# Patient Record
Sex: Female | Born: 1985 | Race: White | Hispanic: No | Marital: Single
Health system: Midwestern US, Academic
[De-identification: ages and names within clinical notes are randomized; demographics above are authoritative.]

---

## 2018-03-18 ENCOUNTER — Encounter: Admit: 2018-03-18 | Discharge: 2018-03-18

## 2018-03-18 ENCOUNTER — Emergency Department: Admit: 2018-03-18 | Discharge: 2018-03-18

## 2018-03-18 ENCOUNTER — Observation Stay: Admit: 2018-03-18 | Discharge: 2018-03-19 | Payer: MEDICAID

## 2018-03-18 DIAGNOSIS — N83209 Unspecified ovarian cyst, unspecified side: Principal | ICD-10-CM

## 2018-03-18 DIAGNOSIS — M545 Low back pain: ICD-10-CM

## 2018-03-18 LAB — POC CREATININE, RAD: Lab: 0.8 mg/dL (ref 0.4–1.00)

## 2018-03-18 MED ORDER — GADOBENATE DIMEGLUMINE 529 MG/ML (0.1MMOL/0.2ML) IV SOLN
20 mL | Freq: Once | INTRAVENOUS | 0 refills | Status: CP
Start: 2018-03-18 — End: ?
  Administered 2018-03-19: 04:00:00 20 mL via INTRAVENOUS

## 2018-03-18 MED ORDER — MORPHINE 4 MG/ML IV CRTG
4 mg | Freq: Once | INTRAVENOUS | 0 refills | Status: CP
Start: 2018-03-18 — End: ?
  Administered 2018-03-19: 03:00:00 4 mg via INTRAVENOUS

## 2018-03-18 MED ORDER — LORAZEPAM 2 MG/ML IJ SOLN
.5-1 mg | Freq: Once | INTRAVENOUS | 0 refills | Status: CP
Start: 2018-03-18 — End: ?
  Administered 2018-03-19: 03:00:00 1 mg via INTRAVENOUS

## 2018-03-18 MED ORDER — CYCLOBENZAPRINE 10 MG PO TAB
10 mg | Freq: Once | ORAL | 0 refills | Status: DC
Start: 2018-03-18 — End: 2018-03-19

## 2018-03-18 MED ORDER — IOHEXOL 350 MG IODINE/ML IV SOLN
100 mL | Freq: Once | INTRAVENOUS | 0 refills | Status: CP
Start: 2018-03-18 — End: ?
  Administered 2018-03-19: 01:00:00 100 mL via INTRAVENOUS

## 2018-03-18 MED ORDER — LACTATED RINGERS IV SOLP
1000 mL | INTRAVENOUS | 0 refills | Status: CP
Start: 2018-03-18 — End: ?
  Administered 2018-03-18: 1000 mL via INTRAVENOUS

## 2018-03-18 MED ORDER — SODIUM CHLORIDE 0.9 % IJ SOLN
50 mL | Freq: Once | INTRAVENOUS | 0 refills | Status: CP
Start: 2018-03-18 — End: ?
  Administered 2018-03-19: 01:00:00 50 mL via INTRAVENOUS

## 2018-03-18 MED ORDER — FENTANYL CITRATE (PF) 50 MCG/ML IJ SOLN
50-75 ug | INTRAVENOUS | 0 refills | Status: CP | PRN
Start: 2018-03-18 — End: ?
  Administered 2018-03-19 (×3): 75 ug via INTRAVENOUS

## 2018-03-18 MED ORDER — ACETAMINOPHEN 500 MG PO TAB
1000 mg | Freq: Once | ORAL | 0 refills | Status: CP
Start: 2018-03-18 — End: ?
  Administered 2018-03-18: 1000 mg via ORAL

## 2018-03-19 ENCOUNTER — Emergency Department: Admit: 2018-03-18 | Discharge: 2018-03-18

## 2018-03-19 ENCOUNTER — Encounter: Admit: 2018-03-19 | Discharge: 2018-03-19

## 2018-03-19 DIAGNOSIS — D509 Iron deficiency anemia, unspecified: ICD-10-CM

## 2018-03-19 DIAGNOSIS — Z0489 Encounter for examination and observation for other specified reasons: ICD-10-CM

## 2018-03-19 DIAGNOSIS — F419 Anxiety disorder, unspecified: ICD-10-CM

## 2018-03-19 DIAGNOSIS — K029 Dental caries, unspecified: ICD-10-CM

## 2018-03-19 DIAGNOSIS — D72829 Elevated white blood cell count, unspecified: ICD-10-CM

## 2018-03-19 DIAGNOSIS — F17210 Nicotine dependence, cigarettes, uncomplicated: ICD-10-CM

## 2018-03-19 DIAGNOSIS — M5441 Lumbago with sciatica, right side: Principal | ICD-10-CM

## 2018-03-19 DIAGNOSIS — Z880 Allergy status to penicillin: ICD-10-CM

## 2018-03-19 DIAGNOSIS — R000 Tachycardia, unspecified: ICD-10-CM

## 2018-03-19 LAB — URINALYSIS DIPSTICK
Lab: NEGATIVE MMOL/L (ref 21–30)
Lab: NEGATIVE U/L (ref 7–40)
Lab: NEGATIVE U/L (ref 7–56)
Lab: NEGATIVE g/dL (ref 3.5–5.0)
Lab: NEGATIVE g/dL (ref 6.0–8.0)
Lab: NEGATIVE mg/dL — ABNORMAL HIGH (ref 8.5–10.6)
Lab: NEGATIVE mg/dL — ABNORMAL LOW (ref 0.3–1.2)

## 2018-03-19 LAB — URINALYSIS, MICROSCOPIC

## 2018-03-19 LAB — PERIPHERAL SMEAR

## 2018-03-19 LAB — COMPREHENSIVE METABOLIC PANEL
Lab: 139 MMOL/L (ref 137–147)
Lab: 7 K/UL — ABNORMAL HIGH (ref 3–12)
Lab: >60 mL/min (ref >60)
Lab: >60 mL/min — ABNORMAL HIGH (ref >60)

## 2018-03-19 LAB — IRON + BINDING CAPACITY + %SAT+ FERRITIN
Lab: 27 ug/dL — ABNORMAL LOW (ref 50–160)
Lab: 5 % — ABNORMAL LOW (ref 28–42)
Lab: 9 ng/mL — ABNORMAL LOW (ref 10–200)

## 2018-03-19 LAB — SED RATE: Lab: 15 mm/h (ref 0–20)

## 2018-03-19 LAB — CBC AND DIFF
Lab: 0.2 10*3/uL (ref 0–0.45)
Lab: 20 10*3/uL — ABNORMAL HIGH (ref 4.5–11.0)

## 2018-03-19 LAB — C REACTIVE PROTEIN (CRP): Lab: 0.6 mg/dL (ref <1.0)

## 2018-03-19 LAB — LACTIC ACID (BG - RAPID LACTATE): Lab: 0.9 MMOL/L (ref 0.5–2.0)

## 2018-03-19 MED ORDER — ENOXAPARIN 40 MG/0.4 ML SC SYRG
40 mg | Freq: Every day | SUBCUTANEOUS | 0 refills | Status: DC
Start: 2018-03-19 — End: 2018-03-19
  Administered 2018-03-19: 10:00:00 40 mg via SUBCUTANEOUS

## 2018-03-19 MED ORDER — ACETAMINOPHEN 325 MG PO TAB
650 mg | ORAL | 0 refills | Status: DC
Start: 2018-03-19 — End: 2018-03-19
  Administered 2018-03-19 (×2): 650 mg via ORAL

## 2018-03-19 MED ORDER — ALPRAZOLAM 0.5 MG PO TAB
1 mg | Freq: Once | ORAL | 0 refills | Status: CP
Start: 2018-03-19 — End: ?
  Administered 2018-03-19: 16:00:00 1 mg via ORAL

## 2018-03-19 MED ORDER — OXYCODONE 15 MG PO TAB
15 mg | ORAL | 0 refills | Status: DC | PRN
Start: 2018-03-19 — End: 2018-03-19
  Administered 2018-03-19 (×2): 15 mg via ORAL

## 2018-03-19 MED ORDER — GABAPENTIN 100 MG PO CAP
100 mg | ORAL | 0 refills | Status: DC
Start: 2018-03-19 — End: 2018-03-19
  Administered 2018-03-19: 10:00:00 100 mg via ORAL

## 2018-03-19 MED ORDER — GABAPENTIN 300 MG PO CAP
300 mg | ORAL_CAPSULE | Freq: Three times a day (TID) | ORAL | 0 refills | Status: AC
Start: 2018-03-19 — End: ?

## 2018-03-19 MED ORDER — OXYCODONE 15 MG PO TAB
15 mg | ORAL | 0 refills | 6.00000 days | Status: AC | PRN
Start: 2018-03-19 — End: 2019-11-30

## 2018-03-19 MED ORDER — MORPHINE 2 MG/ML IV CRTG
2-4 mg | INTRAVENOUS | 0 refills | Status: DC | PRN
Start: 2018-03-19 — End: 2018-03-19
  Administered 2018-03-19: 15:00:00 2 mg via INTRAVENOUS

## 2018-03-19 MED ORDER — GABAPENTIN 300 MG PO CAP
300 mg | ORAL | 0 refills | Status: DC
Start: 2018-03-19 — End: 2018-03-19

## 2018-03-19 MED ORDER — FENTANYL CITRATE (PF) 50 MCG/ML IJ SOLN
75 ug | INTRAVENOUS | 0 refills | Status: CP | PRN
Start: 2018-03-19 — End: ?
  Administered 2018-03-19 (×2): 75 ug via INTRAVENOUS

## 2018-03-25 LAB — CULTURE-BLOOD W/SENSITIVITY

## 2018-05-31 ENCOUNTER — Emergency Department: Admit: 2018-05-31 | Discharge: 2018-05-31

## 2018-05-31 ENCOUNTER — Encounter: Admit: 2018-05-31 | Discharge: 2018-05-31

## 2018-05-31 DIAGNOSIS — N83209 Unspecified ovarian cyst, unspecified side: Principal | ICD-10-CM

## 2018-05-31 DIAGNOSIS — M79605 Pain in left leg: ICD-10-CM

## 2018-05-31 MED ORDER — IMS MIXTURE TEMPLATE
15 mg | Freq: Once | ORAL | 0 refills | Status: CP
Start: 2018-05-31 — End: ?
  Administered 2018-06-01 (×2): 15 mg via ORAL

## 2018-05-31 MED ORDER — MORPHINE 2 MG/ML IV CRTG
4 mg | Freq: Once | INTRAVENOUS | 0 refills | Status: CP
Start: 2018-05-31 — End: ?
  Administered 2018-06-01: 03:00:00 4 mg via INTRAVENOUS

## 2018-05-31 MED ORDER — DIAZEPAM 5 MG/ML IJ SYRG
5 mg | Freq: Once | INTRAVENOUS | 0 refills | Status: CP
Start: 2018-05-31 — End: ?
  Administered 2018-06-01: 04:00:00 5 mg via INTRAVENOUS

## 2018-06-01 ENCOUNTER — Emergency Department: Admit: 2018-05-31 | Discharge: 2018-06-01 | Disposition: A | Discharge status: Home / Self Care | Payer: MEDICAID

## 2018-06-01 DIAGNOSIS — G8929 Other chronic pain: Principal | ICD-10-CM

## 2018-06-01 DIAGNOSIS — Z87891 Personal history of nicotine dependence: ICD-10-CM

## 2018-06-01 DIAGNOSIS — Z981 Arthrodesis status: ICD-10-CM

## 2018-06-01 DIAGNOSIS — M545 Low back pain: ICD-10-CM

## 2018-06-01 LAB — CBC AND DIFF
Lab: 0.1 10*3/uL (ref 0–0.45)
Lab: 0.2 10*3/uL (ref 0–0.20)
Lab: 12 10*3/uL — ABNORMAL HIGH (ref 4.5–11.0)

## 2018-06-01 LAB — COMPREHENSIVE METABOLIC PANEL
Lab: 0.5 mg/dL (ref 0.3–1.2)
Lab: 0.7 mg/dL — ABNORMAL LOW (ref 0.4–1.00)
Lab: 12 U/L (ref 7–40)
Lab: 139 MMOL/L — ABNORMAL HIGH (ref 137–147)
Lab: 3.9 g/dL (ref 3.5–5.0)
Lab: 6 mg/dL — ABNORMAL LOW (ref 7–25)
Lab: 7.1 g/dL — ABNORMAL HIGH (ref 6.0–8.0)
Lab: 8 U/L (ref 7–56)
Lab: 9 10*3/uL — ABNORMAL HIGH (ref 3–12)
Lab: 9.5 mg/dL — ABNORMAL HIGH (ref 8.5–10.6)
Lab: 90 mg/dL — ABNORMAL LOW (ref 70–100)
Lab: >60 mL/min (ref >60)
Lab: >60 mL/min — ABNORMAL HIGH (ref >60)

## 2018-06-01 LAB — SED RATE: Lab: 62 mm/h — ABNORMAL HIGH (ref 0–20)

## 2018-06-01 LAB — C REACTIVE PROTEIN (CRP): Lab: 2.2 mg/dL — ABNORMAL HIGH (ref <1.0)

## 2018-06-01 MED ORDER — GADOBENATE DIMEGLUMINE 529 MG/ML (0.1MMOL/0.2ML) IV SOLN
17 mL | Freq: Once | INTRAVENOUS | 0 refills | Status: CP
Start: 2018-06-01 — End: ?
  Administered 2018-06-01: 06:00:00 17 mL via INTRAVENOUS

## 2018-06-01 MED ORDER — MORPHINE 2 MG/ML IV CRTG
4 mg | INTRAVENOUS | 0 refills | Status: DC | PRN
Start: 2018-06-01 — End: 2018-06-01
  Administered 2018-06-01: 07:00:00 4 mg via INTRAVENOUS

## 2018-07-13 ENCOUNTER — Encounter: Admit: 2018-07-13 | Discharge: 2018-07-13

## 2018-07-13 ENCOUNTER — Encounter: Admit: 2018-07-13 | Discharge: 2018-07-14

## 2018-07-13 DIAGNOSIS — L0291 Cutaneous abscess, unspecified: ICD-10-CM

## 2018-07-13 DIAGNOSIS — D72829 Elevated white blood cell count, unspecified: ICD-10-CM

## 2018-07-13 DIAGNOSIS — Z967 Presence of other bone and tendon implants: ICD-10-CM

## 2018-07-13 DIAGNOSIS — M545 Low back pain: Secondary | ICD-10-CM

## 2018-07-13 MED ORDER — LEVONORGESTREL-ETHINYL ESTRAD 0.15-0.03 MG PO TAB
1 | Freq: Every day | ORAL | 0 refills | Status: DC
Start: 2018-07-13 — End: 2018-07-14

## 2018-07-13 MED ORDER — ALPRAZOLAM 1 MG PO TAB
1 mg | Freq: Three times a day (TID) | ORAL | 0 refills | Status: DC | PRN
Start: 2018-07-13 — End: 2018-07-16
  Administered 2018-07-14 – 2018-07-16 (×6): 1 mg via ORAL

## 2018-07-13 MED ORDER — KETOROLAC 15 MG/ML IJ SOLN
15 mg | Freq: Once | INTRAVENOUS | 0 refills | Status: CP
Start: 2018-07-13 — End: ?
  Administered 2018-07-14: 05:00:00 15 mg via INTRAVENOUS

## 2018-07-13 MED ORDER — OXYCODONE 15 MG PO TAB
15 mg | ORAL | 0 refills | Status: DC | PRN
Start: 2018-07-13 — End: 2018-07-15
  Administered 2018-07-14 – 2018-07-15 (×8): 15 mg via ORAL

## 2018-07-13 MED ORDER — PANTOPRAZOLE 40 MG PO TBEC
40 mg | Freq: Every day | ORAL | 0 refills | Status: DC
Start: 2018-07-13 — End: 2018-07-16
  Administered 2018-07-15 – 2018-07-16 (×2): 40 mg via ORAL

## 2018-07-13 MED ORDER — ACETAMINOPHEN 325 MG PO TAB
650 mg | ORAL | 0 refills | Status: DC
Start: 2018-07-13 — End: 2018-07-16
  Administered 2018-07-14 – 2018-07-16 (×9): 650 mg via ORAL

## 2018-07-13 MED ORDER — HEPARIN, PORCINE (PF) 5,000 UNIT/0.5 ML IJ SYRG
5000 [IU] | SUBCUTANEOUS | 0 refills | Status: DC
Start: 2018-07-13 — End: 2018-07-16
  Administered 2018-07-14 – 2018-07-16 (×7): 5000 [IU] via SUBCUTANEOUS

## 2018-07-13 MED ORDER — GABAPENTIN 300 MG PO CAP
300 mg | Freq: Three times a day (TID) | ORAL | 0 refills | Status: DC
Start: 2018-07-13 — End: 2018-07-14
  Administered 2018-07-14: 14:00:00 300 mg via ORAL

## 2018-07-14 ENCOUNTER — Encounter: Admit: 2018-07-14 | Discharge: 2018-07-14

## 2018-07-14 ENCOUNTER — Inpatient Hospital Stay: Admit: 2018-07-14 | Discharge: 2018-07-15

## 2018-07-14 ENCOUNTER — Inpatient Hospital Stay
Admit: 2018-07-14 | Discharge: 2018-07-16 | Disposition: A | Discharge status: Home / Self Care | Payer: MEDICAID | Source: Other Acute Inpatient Hospital

## 2018-07-14 LAB — CBC AND DIFF
Lab: 0.2 10*3/uL (ref 0–0.20)
Lab: 13 10*3/uL — ABNORMAL HIGH (ref 4.5–11.0)

## 2018-07-14 LAB — URINALYSIS DIPSTICK
Lab: NEGATIVE K/UL (ref 3–12)
Lab: NEGATIVE mL/min (ref 0–0.45)
Lab: NEGATIVE mL/min (ref 0–0.80)

## 2018-07-14 LAB — COMPREHENSIVE METABOLIC PANEL
Lab: 138 MMOL/L — ABNORMAL LOW (ref 137–147)
Lab: 3.8 MMOL/L — ABNORMAL LOW (ref 3.5–5.1)

## 2018-07-14 LAB — PHOSPHORUS: Lab: 5.1 mg/dL — ABNORMAL HIGH (ref 2.0–4.5)

## 2018-07-14 LAB — CBC: Lab: 11 K/UL — ABNORMAL HIGH (ref 4.5–11.0)

## 2018-07-14 LAB — C REACTIVE PROTEIN (CRP): Lab: 0 mg/dL (ref <1.0)

## 2018-07-14 LAB — SED RATE: Lab: 27 mm/h — ABNORMAL HIGH (ref 0–20)

## 2018-07-14 LAB — URINALYSIS, MICROSCOPIC

## 2018-07-14 LAB — LACTIC ACID (BG - RAPID LACTATE): Lab: 1 MMOL/L — ABNORMAL LOW (ref 0.5–2.0)

## 2018-07-14 LAB — PROCALCITONIN: Lab: 0 ng/mL (ref <0.11)

## 2018-07-14 LAB — MAGNESIUM: Lab: 2 mg/dL — ABNORMAL LOW (ref 1.6–2.6)

## 2018-07-14 MED ORDER — MORPHINE 2 MG/ML IV CRTG
2-4 mg | INTRAVENOUS | 0 refills | Status: DC | PRN
Start: 2018-07-14 — End: 2018-07-15
  Administered 2018-07-14 – 2018-07-15 (×8): 4 mg via INTRAVENOUS

## 2018-07-14 MED ORDER — BUSPIRONE 10 MG PO TAB
10 mg | Freq: Three times a day (TID) | ORAL | 0 refills | Status: DC
Start: 2018-07-14 — End: 2018-07-16
  Administered 2018-07-14 – 2018-07-16 (×7): 10 mg via ORAL

## 2018-07-14 MED ORDER — MORPHINE 4 MG/ML IV CRTG
4 mg | Freq: Once | INTRAVENOUS | 0 refills | Status: CP | PRN
Start: 2018-07-14 — End: ?
  Administered 2018-07-14: 06:00:00 4 mg via INTRAVENOUS

## 2018-07-14 MED ORDER — FENTANYL CITRATE (PF) 50 MCG/ML IJ SOLN
25-50 ug | INTRAVENOUS | 0 refills | Status: DC | PRN
Start: 2018-07-14 — End: 2018-07-14
  Administered 2018-07-14: 16:00:00 50 ug via INTRAVENOUS

## 2018-07-14 MED ORDER — FLUOXETINE 10 MG PO CAP
10 mg | Freq: Every day | ORAL | 0 refills | Status: DC
Start: 2018-07-14 — End: 2018-07-16
  Administered 2018-07-14 – 2018-07-16 (×3): 10 mg via ORAL

## 2018-07-14 MED ORDER — GADOBENATE DIMEGLUMINE 529 MG/ML (0.1MMOL/0.2ML) IV SOLN
15 mL | Freq: Once | INTRAVENOUS | 0 refills | Status: CP
Start: 2018-07-14 — End: ?
  Administered 2018-07-14: 06:00:00 15 mL via INTRAVENOUS

## 2018-07-14 MED ORDER — SODIUM CHLORIDE 0.9 % IV SOLP
1000 mL | INTRAVENOUS | 0 refills | Status: CP
Start: 2018-07-14 — End: ?
  Administered 2018-07-14: 23:00:00 1000 mL via INTRAVENOUS

## 2018-07-14 MED ORDER — IMS MIXTURE TEMPLATE
400 mg | Freq: Three times a day (TID) | ORAL | 0 refills | Status: DC
Start: 2018-07-14 — End: 2018-07-16
  Administered 2018-07-14 – 2018-07-16 (×12): 400 mg via ORAL

## 2018-07-15 LAB — CULTURE-URINE W/SENSITIVITY

## 2018-07-15 LAB — CBC: Lab: 10 K/UL — ABNORMAL LOW (ref >60)

## 2018-07-15 LAB — BASIC METABOLIC PANEL: Lab: 139 MMOL/L — ABNORMAL LOW (ref >60)

## 2018-07-15 MED ORDER — IRON SUCROSE 300 MG IRON/15 ML IV SOLN
300 mg | Freq: Every day | INTRAVENOUS | 0 refills | Status: DC
Start: 2018-07-15 — End: 2018-07-16
  Administered 2018-07-15 (×2): 300 mg via INTRAVENOUS

## 2018-07-15 MED ORDER — IMS MIXTURE TEMPLATE
20 mg | ORAL | 0 refills | Status: DC | PRN
Start: 2018-07-15 — End: 2018-07-16
  Administered 2018-07-15 – 2018-07-16 (×14): 20 mg via ORAL

## 2018-07-15 MED ORDER — HYDROMORPHONE (PF) 2 MG/ML IJ SYRG
.5-1 mg | INTRAVENOUS | 0 refills | Status: DC | PRN
Start: 2018-07-15 — End: 2018-07-16
  Administered 2018-07-15 – 2018-07-16 (×6): 1 mg via INTRAVENOUS

## 2018-07-16 ENCOUNTER — Inpatient Hospital Stay: Admit: 2018-07-14 | Discharge: 2018-07-14

## 2018-07-16 DIAGNOSIS — F419 Anxiety disorder, unspecified: ICD-10-CM

## 2018-07-16 DIAGNOSIS — Z90710 Acquired absence of both cervix and uterus: ICD-10-CM

## 2018-07-16 DIAGNOSIS — Z9049 Acquired absence of other specified parts of digestive tract: ICD-10-CM

## 2018-07-16 DIAGNOSIS — T8484XA Pain due to internal orthopedic prosthetic devices, implants and grafts, initial encounter: Principal | ICD-10-CM

## 2018-07-16 DIAGNOSIS — Z981 Arthrodesis status: ICD-10-CM

## 2018-07-16 DIAGNOSIS — M5431 Sciatica, right side: ICD-10-CM

## 2018-07-16 DIAGNOSIS — K219 Gastro-esophageal reflux disease without esophagitis: ICD-10-CM

## 2018-07-16 DIAGNOSIS — M545 Low back pain: ICD-10-CM

## 2018-07-16 DIAGNOSIS — G8928 Other chronic postprocedural pain: ICD-10-CM

## 2018-07-16 DIAGNOSIS — Z87891 Personal history of nicotine dependence: ICD-10-CM

## 2018-07-16 DIAGNOSIS — F329 Major depressive disorder, single episode, unspecified: ICD-10-CM

## 2018-07-16 LAB — CBC: Lab: 11 K/UL — ABNORMAL HIGH (ref 4.5–11.0)

## 2018-07-16 MED ORDER — OXYCODONE 20 MG PO TAB
20 mg | ORAL_TABLET | ORAL | 0 refills | 6.00000 days | Status: AC | PRN
Start: 2018-07-16 — End: 2018-07-16

## 2018-07-20 LAB — CULTURE-BLOOD W/SENSITIVITY

## 2019-11-28 ENCOUNTER — Encounter: Admit: 2019-11-28 | Discharge: 2019-11-28

## 2019-11-28 ENCOUNTER — Emergency Department: Admit: 2019-11-28 | Discharge: 2019-11-28

## 2019-11-28 ENCOUNTER — Inpatient Hospital Stay: Admit: 2019-11-28 | Payer: MEDICAID

## 2019-11-28 DIAGNOSIS — N83209 Unspecified ovarian cyst, unspecified side: Secondary | ICD-10-CM

## 2019-11-28 DIAGNOSIS — M549 Dorsalgia, unspecified: Secondary | ICD-10-CM

## 2019-11-28 LAB — URINALYSIS MICROSCOPIC REFLEX TO CULTURE

## 2019-11-28 LAB — URINALYSIS DIPSTICK REFLEX TO CULTURE
Lab: NEGATIVE
Lab: NEGATIVE
Lab: NEGATIVE
Lab: NEGATIVE
Lab: NEGATIVE
Lab: NEGATIVE

## 2019-11-28 LAB — COMPREHENSIVE METABOLIC PANEL
Lab: 0.5 mg/dL (ref 0.3–1.2)
Lab: 0.6 mg/dL (ref 0.4–1.00)
Lab: 101 MMOL/L (ref 98–110)
Lab: 139 MMOL/L (ref 137–147)
Lab: 14 K/UL — ABNORMAL HIGH (ref 3–12)
Lab: 141 U/L — ABNORMAL HIGH (ref 25–110)
Lab: 24 MMOL/L (ref 21–30)
Lab: 4.2 MMOL/L (ref 3.5–5.1)
Lab: 4.2 g/dL (ref 3.5–5.0)
Lab: 48 U/L — ABNORMAL HIGH (ref 7–40)
Lab: 49 U/L (ref 7–56)
Lab: 5 mg/dL — ABNORMAL LOW (ref 7–25)
Lab: 7.2 g/dL (ref 6.0–8.0)
Lab: 85 mg/dL (ref 70–100)
Lab: 9.5 mg/dL (ref 8.5–10.6)
Lab: >60 mL/min (ref >60)
Lab: >60 mL/min — ABNORMAL HIGH (ref >60)

## 2019-11-28 LAB — C REACTIVE PROTEIN (CRP): Lab: 1.3 mg/dL — ABNORMAL HIGH (ref <1.0)

## 2019-11-28 LAB — CBC AND DIFF
Lab: 0.1 10*3/uL (ref 0–0.45)
Lab: 0.2 10*3/uL (ref 0–0.20)
Lab: 17 10*3/uL — ABNORMAL HIGH (ref 4.5–11.0)
Lab: 19 (ref <20.7)

## 2019-11-28 LAB — BETA-HCG: Lab: <1 U/L (ref <5)

## 2019-11-28 LAB — SED RATE: Lab: 35 mm/h — ABNORMAL HIGH (ref 0–20)

## 2019-11-28 LAB — POC LACTATE: Lab: 0.7 MMOL/L (ref 0.5–2.0)

## 2019-11-28 MED ORDER — MORPHINE 2 MG/ML IV SYRG
2-4 mg | INTRAVENOUS | 0 refills | Status: DC | PRN
Start: 2019-11-28 — End: 2019-12-01
  Administered 2019-11-29 – 2019-12-01 (×16): 4 mg via INTRAVENOUS

## 2019-11-28 MED ORDER — OXYCODONE 5 MG PO TAB
5-15 mg | ORAL | 0 refills | Status: DC | PRN
Start: 2019-11-28 — End: 2019-11-29
  Administered 2019-11-28 – 2019-11-29 (×6): 15 mg via ORAL

## 2019-11-28 MED ORDER — ENOXAPARIN 40 MG/0.4 ML SC SYRG
40 mg | Freq: Every day | SUBCUTANEOUS | 0 refills | Status: DC
Start: 2019-11-28 — End: 2019-12-02
  Administered 2019-11-29 – 2019-11-30 (×2): 40 mg via SUBCUTANEOUS

## 2019-11-28 MED ORDER — SENNOSIDES-DOCUSATE SODIUM 8.6-50 MG PO TAB
1 | Freq: Two times a day (BID) | ORAL | 0 refills | Status: DC | PRN
Start: 2019-11-28 — End: 2019-12-02

## 2019-11-28 MED ORDER — MORPHINE 2 MG/ML IV SYRG
4 mg | Freq: Once | INTRAVENOUS | 0 refills | Status: CP
Start: 2019-11-28 — End: ?
  Administered 2019-11-28: 20:00:00 4 mg via INTRAVENOUS

## 2019-11-28 MED ORDER — LACTATED RINGERS IV SOLP
1000 mL | INTRAVENOUS | 0 refills | Status: CP
Start: 2019-11-28 — End: ?
  Administered 2019-11-28: 18:00:00 1000 mL via INTRAVENOUS

## 2019-11-28 MED ORDER — KETOROLAC 30 MG/ML (1 ML) IJ SOLN
30 mg | INTRAVENOUS | 0 refills | Status: DC | PRN
Start: 2019-11-28 — End: 2019-12-01

## 2019-11-28 MED ORDER — OXYCODONE 5 MG PO TAB
5-10 mg | ORAL | 0 refills | Status: DC | PRN
Start: 2019-11-28 — End: 2019-11-28

## 2019-11-28 MED ORDER — IMS MIXTURE TEMPLATE
15 mg | Freq: Once | ORAL | 0 refills | Status: CP
Start: 2019-11-28 — End: ?
  Administered 2019-11-28: 18:00:00 15 mg via ORAL

## 2019-11-28 MED ORDER — POLYETHYLENE GLYCOL 3350 17 GRAM PO PWPK
1 | Freq: Every day | ORAL | 0 refills | Status: DC
Start: 2019-11-28 — End: 2019-12-02
  Administered 2019-11-28 – 2019-11-29 (×2): 17 g via ORAL

## 2019-11-28 MED ORDER — LORAZEPAM 2 MG/ML IJ SOLN
1-2 mg | INTRAVENOUS | 0 refills | Status: DC | PRN
Start: 2019-11-28 — End: 2019-11-28

## 2019-11-28 MED ORDER — LORAZEPAM 2 MG/ML IJ SOLN
1-2 mg | Freq: Once | INTRAVENOUS | 0 refills | Status: CP
Start: 2019-11-28 — End: ?
  Administered 2019-11-28: 23:00:00 2 mg via INTRAVENOUS

## 2019-11-28 MED ORDER — MORPHINE 2 MG/ML IV SYRG
4 mg | Freq: Once | INTRAVENOUS | 0 refills | Status: CP
Start: 2019-11-28 — End: ?
  Administered 2019-11-28: 21:00:00 4 mg via INTRAVENOUS

## 2019-11-28 NOTE — ED Notes
Dr. Hilary Hertz texted again for pain medication orders

## 2019-11-28 NOTE — ED Provider Notes
Rhonda Prince is a 34 y.o. female.    Chief Complaint:  Chief Complaint   Patient presents with   ? Back Pain     acute on chronic, fell yesterday down three stairs       History of Present Illness:  Patient is a 34 year old female with history of chronic back pain, s/p laminectomy x2, presenting with acute back pain after a fall.  Patient reports that she last had surgery on her back in October 2019, and since then her pain has been relatively stable.  However, yesterday, she lost her balance when going up stairs and fell backwards onto her left side.  She had immediate onset of lumbar back pain, in the same spot that it usually is but much more severe.  She has been able to walk but has significant difficulty secondary to pain.  She denies numbness, tingling, weakness in her extremities.  She denies numbness of the groin area.  She denies bowel or bladder incontinence.  She otherwise has not been sick lately and denies fevers, chills, nausea, vomiting.          Review of Systems:  Review of Systems   Constitutional: Negative for fever.   HENT: Negative for sore throat.    Eyes: Negative for visual disturbance.   Respiratory: Negative for shortness of breath.    Cardiovascular: Negative for chest pain.   Gastrointestinal: Negative for abdominal pain.   Genitourinary: Negative for dysuria.   Musculoskeletal: Positive for back pain.   Skin: Negative for rash.   Neurological: Negative for headaches.       Allergies:  Pcn [penicillins]    Past Medical History:  Medical History:   Diagnosis Date   ? Ovarian cyst        Past Surgical History:  Surgical History:   Procedure Laterality Date   ? HX APPENDECTOMY     ? HX HYSTERECTOMY         Pertinent medical/surgical history reviewed    Social History:  Social History     Tobacco Use   ? Smoking status: Former Smoker     Packs/day: 0.50     Types: Cigarettes   ? Smokeless tobacco: Never Used   Substance Use Topics   ? Alcohol use: No   ? Drug use: No     Social History Substance and Sexual Activity   Drug Use No       Family History:  No family history on file.    Vitals:  ED Vitals    Date and Time T BP P RR SPO2P SPO2 User   11/28/19 1615 -- -- 98 13 PER MINUTE 99 97 % RB   11/28/19 1600 -- 101/73 118 15 PER MINUTE 111 99 % RB   11/28/19 1545 -- -- 128 16 PER MINUTE 128 99 % RB   11/28/19 1530 -- 115/92 94 9 PER MINUTE 92 97 % RB   11/28/19 1500 -- 135/88 102 13 PER MINUTE 97 97 % RB   11/28/19 1415 -- -- 119 11 PER MINUTE 119 100 % RB   11/28/19 1400 -- 109/93 107 17 PER MINUTE 103 98 % RB   11/28/19 1332 -- 112/83 103 13 PER MINUTE 106 99 % RB   11/28/19 1231 -- 117/88 99 18 PER MINUTE 99 100 % RB   11/28/19 1131 -- -- 113 14 PER MINUTE 113 99 % RB   11/28/19 1101 -- -- 123 14 PER MINUTE 122 99 % RB  11/28/19 1100 -- 119/98 123 -- -- -- RB   11/28/19 1030 36.8 ?C (98.2 ?F) 139/107 -- 20 PER MINUTE 135 98 % BB          Physical Exam:  Physical Exam  Vitals signs and nursing note reviewed.   Constitutional:       Appearance: Normal appearance. She is normal weight.   HENT:      Head: Normocephalic and atraumatic.   Eyes:      Extraocular Movements: Extraocular movements intact.      Conjunctiva/sclera: Conjunctivae normal.   Neck:      Musculoskeletal: Neck supple.   Cardiovascular:      Rate and Rhythm: Regular rhythm. Tachycardia present.   Pulmonary:      Effort: Pulmonary effort is normal.      Breath sounds: Normal breath sounds.   Abdominal:      General: Bowel sounds are normal. There is no distension.      Palpations: Abdomen is soft.      Tenderness: There is no abdominal tenderness.   Musculoskeletal: Normal range of motion.      Lumbar back: She exhibits bony tenderness. She exhibits no swelling.   Neurological:      Mental Status: She is oriented to person, place, and time. Mental status is at baseline.      GCS: GCS eye subscore is 4. GCS verbal subscore is 5. GCS motor subscore is 6.      Sensory: Sensation is intact.      Motor: Motor function is intact. Psychiatric:         Mood and Affect: Mood normal.         Behavior: Behavior normal.         Laboratory Results:  Labs Reviewed   CBC AND DIFF - Abnormal       Result Value Ref Range Status    White Blood Cells 17.2 (*) 4.5 - 11.0 K/UL Final    RBC 4.95  4.0 - 5.0 M/UL Final    Hemoglobin 14.4  12.0 - 15.0 GM/DL Final    Hematocrit 16.1  36 - 45 % Final    MCV 87.5  80 - 100 FL Final    MCH 29.2  26 - 34 PG Final    MCHC 33.4  32.0 - 36.0 G/DL Final    RDW 09.6  11 - 15 % Final    Platelet Count 398  150 - 400 K/UL Final    MPV 8.1  7 - 11 FL Final    Neutrophils 77  41 - 77 % Final    Lymphocytes 16 (*) 24 - 44 % Final    Monocytes 5  4 - 12 % Final    Eosinophils 1  0 - 5 % Final    Basophils 1  0 - 2 % Final    Absolute Neutrophil Count 13.28 (*) 1.8 - 7.0 K/UL Final    Absolute Lymph Count 2.76  1.0 - 4.8 K/UL Final    Absolute Monocyte Count 0.88 (*) 0 - 0.80 K/UL Final    Absolute Eosinophil Count 0.12  0 - 0.45 K/UL Final    Absolute Basophil Count 0.20  0 - 0.20 K/UL Final    MDW (Monocyte Distribution Width) 19.6  <20.7 Final   COMPREHENSIVE METABOLIC PANEL - Abnormal    Sodium 139  137 - 147 MMOL/L Final    Potassium 4.2  3.5 - 5.1 MMOL/L Final    Chloride  101  98 - 110 MMOL/L Final    Glucose 85  70 - 100 MG/DL Final    Blood Urea Nitrogen 5 (*) 7 - 25 MG/DL Final    Creatinine 8.29  0.4 - 1.00 MG/DL Final    Calcium 9.5  8.5 - 10.6 MG/DL Final    Total Protein 7.2  6.0 - 8.0 G/DL Final    Total Bilirubin 0.5  0.3 - 1.2 MG/DL Final    Albumin 4.2  3.5 - 5.0 G/DL Final    Alk Phosphatase 141 (*) 25 - 110 U/L Final    AST (SGOT) 48 (*) 7 - 40 U/L Final    CO2 24  21 - 30 MMOL/L Final    ALT (SGPT) 49  7 - 56 U/L Final    Anion Gap 14 (*) 3 - 12 Final    eGFR Non African American >60  >60 mL/min Final    eGFR African American >60  >60 mL/min Final   C REACTIVE PROTEIN (CRP) - Abnormal    C-Reactive Protein 1.34 (*) <1.0 MG/DL Final   SED RATE - Abnormal    Sed Rate -ESR 35 (*) 0 - 20 MM/HR Final URINALYSIS DIPSTICK REFLEX TO CULTURE    Color,UA YELLOW   Final    Turbidity,UA CLEAR  CLEAR-CLEAR Final    Specific Gravity-Urine 1.009  1.003 - 1.035 Final    pH,UA 6.0  5.0 - 8.0 Final    Protein,UA NEG  NEG-NEG Final    Glucose,UA NEG  NEG-NEG Final    Ketones,UA NEG  NEG-NEG Final    Bilirubin,UA NEG  NEG-NEG Final    Blood,UA NEG  NEG-NEG Final    Urobilinogen,UA NORMAL  NORM-NORMAL Final    Nitrite,UA NEG  NEG-NEG Final    Leukocytes,UA NEG  NEG-NEG Final    Urine Ascorbic Acid, UA NEG  NEG-NEG Final   URINALYSIS MICROSCOPIC REFLEX TO CULTURE    WBCs,UA 0-2  0 - 2 /HPF Final    RBCs,UA 0-2  0 - 3 /HPF Final    Comment,UA     Final    Value: Criteria for reflex to culture are WBC>10, Positive Nitrite, and/or >=+1   leukocytes. If quantity is not sufficient, an addendum will follow.      MucousUA TRACE   Final    Squamous Epithelial Cells 0-2  0 - 5 Final   POC LACTATE    LACTIC ACID POC 0.7  0.5 - 2.0 MMOL/L Final   UA REFLEX LABEL   URINE CLEAR TOP TUBE   SED RATE   C REACTIVE PROTEIN (CRP)   POC LACTATE   POC LACTATE   POC URINE PREGNANCY     Urine Pregnancy  Urine Pregnancy: (S) Negative  QC: Acceptable    Radiology Interpretation:    CT L-SPINE WO CONTRAST   Final Result         1.  No acute fracture.   2.  Prior posterior decompression at L5-S1 with interbody spacer placed in posterior instrumented fusion at L5-S1. Hardware appears intact. There is no periprosthetic lucency to suggest loosening. No appreciable osseous endplate bridging about the interbody metallic cage spacer at L5-S1.   3.  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      By my electronic signature, I attest that I have personally reviewed the images for this examination and formulated the interpretations  and opinions expressed in this report          Finalized by Donnald Garre on 11/28/2019 2:45 PM. Dictated by Elijah Birk, M.D. on 11/28/2019 2:03 PM.         MRI L-SPINE WO/W CONTRAST    (Results Pending)         EKG:    n/a    ED Course:  34 y.o. female presented to the ED for back pain.  Patient was evaluated by resident and attending physicians. Available records were reviewed. Vitals were noted, significant for tachycardia.     Physical exam significant for significant L-spine bony tenderness.  Neuro exam nonfocal, however range of motion of left leg limited secondary to pain.    Differential diagnosis includes, but not limited to, acute fracture/dislocation versus herniation of disc versus infectious process.    Initial labs and imaging were obtained. CBC with significant leukocytosis to 17.2. ESR and CRP elevated to 35 and 1.34.   Patient was given morphine for symptomatic relief. UA non-infectious. Workup otherwise unremarkable. CT L-spine showed no acute fracture or dislocation.     Tachycardia unresolved after 1L LR.    On re-evaluation, pain and tachycardia are persistent. Given leukocytosis and tachycardia without infectious source, will admit at this time for further workup. Paraspinal abscess possible.     Findings discussed with patient and the plan for admission. Patient understands and agrees with plan. Patient reassessed with appropriate vital signs and symptom control for transfer. Patient verbalized understanding. Admission team was notified and patient was accepted to inpatient service. All questions were answered prior to transfer to inpatient service.         ED Scoring:                             MDM  Reviewed: previous chart, nursing note and vitals  Reviewed previous: labs and CT scan  Interpretation: labs and CT scan        Facility Administered Meds:  Medications   oxyCODONE (ROXICODONE) tablet 15 mg (15 mg Oral Given 11/28/19 1153)   lactated ringers infusion (0 mL Intravenous Infusion Stopped 11/28/19 1528)   morphine injection 4 mg (4 mg Intravenous Given 11/28/19 1336)   morphine injection 4 mg (4 mg Intravenous Given 11/28/19 1519)         Clinical Impression:  Clinical Impression   None       Disposition/Follow up  ED Disposition     ED Disposition    Admit        No follow-up provider specified.    Medications:  Current Discharge Medication List          Procedure Notes:  Procedures      Suanne Marker, MD  PGY-1  Department of Emergency Medicine  Pager [phone], also available via [facility]

## 2019-11-28 NOTE — ED Notes
Dr. Hilary Hertz texted for pain medication orders

## 2019-11-28 NOTE — H&P (View-Only)
Admission History and Physical    Name:  Rhonda Prince                                                     MRN:  [id number]   Admission Date:  11/28/2019  Admission Diagnosis: Back pain [M54.9]     Principal Problem:    Back pain      Assessment/Plan:       This is a 34 year old female with PMH of chronic lower back pain, L5-S1 disk prtorusion s/p decompression/fusion, she then developed a loose rod following her surgery -> removed and replaced 07/18/2018 which was her last surgery.  She presented to [facility] ER on March 12 following a fall at home with acute on chronic back pain.  In the ER she was found to be tachycardic.  Her labs showed leukocytosis of 17.  She was admitted to medicine for further evaluation of leukocytosis along with pain management    Acute on chronic back pain  L5/S1 disc protrusion s/p decompression/fusion with -> loose hardware s/p exchange/re implant  - THe patient is presenting with acute on chronic pain following a fall (lsot her balance)  - SHe was brought by a friend to the ED. She normally is able to walk on her own but due to the pain over the past 24 hours she has had limited mobility due to pain  - CT L spine in the ED without fracture/acute pathology  - 2/4 SIRS in the ED: tachyacrdia/leukocytosis but no localizing infections Sx   - She has not had any infection in her spine previously  Plan  - Admit to IM, obtain MRI L/sacrum w/wo, Oxycodone 5-10 mg Q4H PRN + Morphine 2-4 mg Q4H PRN for pain + Toradol for pain, if her MRI is negative/rest of of infectious workup is negative -> she can be discharged to follow up with her NS at [facility] hospital in [place]  - It seems like she follows with a pain management doctor, Dr Felipa Furnace. She was seen in [facility] ED last in 11/2018, her overdose risk score at that time was high (Opioids/Alprazolam)at that time. Pe rnote, ED staff discussed with her pain doctor who recommended against providing her any Opioids on discharge, she eventually left AMA after ED did not agree on refilling her pain meds. Unless we can confirm through K tracs that she is out of any pain medications, she should follow with her pain doctor for any refills    Leukocytosis/tachycardia  - Again, there is no localizing infectious Sx  - Obtain blood cultures, hold off on Abx    FEN  - No IVF  - Daily labs  - Regular diet    VTE PPX  - Lovenox    LDA  - PIV    PTA ambulation/ADLs: Independent    Dispo  - Admit to IM    Code status: Full      Subjective:    CC Back pain    HPI: Rhonda Prince is a 34 y.o. female she presents to the ER today with acute on chronic back pain.  She was doing okay since her last surgery with hardware explant/reimplant on July 18, 2018.  She has chronic back pain that she follows with a pain management doctor, Dr. Felipa Furnace, who prescribes her oxycodone.  She had  a fall at home the day prior to admission, she was going up the stairs when she lost her balance and fell backwards.  Since that she is been having acute on chronic lower back pain.  Denies any draining wound/erythema in the area.  Denies any previous history of infections.  Denies any fevers or chills.  Denies any other infectious symptomatology.  She denies any numbness or tingling in her legs, the back pain is in the middle and radiates to the left side.  Denies any loss of bladder or bowel control.     Medical History:   Diagnosis Date   ? Ovarian cyst         Surgical History:   Procedure Laterality Date   ? HX APPENDECTOMY     ? HX HYSTERECTOMY       No family history on file.         Social History     Socioeconomic History   ? Marital status: Single     Spouse name: Not on file   ? Number of children: Not on file   ? Years of education: Not on file   ? Highest education level: Not on file   Occupational History   ? Not on file   Tobacco Use   ? Smoking status: Former Smoker     Packs/day: 0.50     Types: Cigarettes   ? Smokeless tobacco: Never Used   Substance and Sexual Activity   ? Alcohol use: No   ? Drug use: No   ? Sexual activity: Not on file   Other Topics Concern   ? Not on file   Social History Narrative   ? Not on file          Allergies   Allergen Reactions   ? Pcn [Penicillins] RASH        Review of Systems    System   Positives   Negatives     Constitutional:   Fever, chills, unintentional weight loss    Eyes:   Diplopia, blurry vision    ENT:   Congestion, sinus pain, ear pain, ear discharge   Respiratory:   Cough, Shortness of breath   Cardiovascular:   Palpitations, claudication, Chest pain   Gastrointestinal:   Vomiting, abdominal pain, hematemesis, melena    Genitourinary:   Dysuria, blood in urine, incontinence   Hematologic:   Bleeding dyscrasias, easy bruising    Musculoskeletal:  acute on lower back pain myalgias, joints swelling, joints erythema   Neurological:   Headache, numbness, tingling, loss of consciousness, seizures, tremors    Behavioral/Psych:   Suicidal ideation, hallucinations   Skin:   Rash, itching, skin abrasions, infected skin lesions       Objective:      Vital Signs: Last Filed In 24 Hours Vital Signs: 24 Hour Range   BP: 101/73 (03/12 1600)  Temp: 36.8 ?C (98.2 ?F) (03/12 1030)  Pulse: 98 (03/12 1615)  Respirations: 13 PER MINUTE (03/12 1615)  SpO2: 97 % (03/12 1615)  SpO2 Pulse: 99 (03/12 1615) BP: (101-139)/(73-107)   Temp:  [36.8 ?C (98.2 ?F)]   Pulse:  [94-128]   Respirations:  [9 PER MINUTE-20 PER MINUTE]   SpO2:  [97 %-100 %]      PHYSICAL EXAMINATION:    General Appearance: Cooperative, NAD  Head: NC/AT  Eyes: EOMs intact.   Lungs: CTAB  Heart: RRR, no m/r/g  Abdomen: Soft, NT, +BS  Extremities: Atraumatic, no edema  Neurologic:  A, OX3, CNII - XII grossly intact; no gross focal deficits        LABS:  Recent Labs     11/28/19  1113   NA 139   K 4.2   CL 101   CO2 24   GAP 14*   BUN 5*   CR 0.62   GLU 85   CA 9.5   ALBUMIN 4.2       Recent Labs     11/28/19  1113   WBC 17.2*   HGB 14.4   HCT 43.3   PLTCT 398   AST 48*   ALT 49   ALKPHOS 141*      CrCl cannot be calculated (Unknown ideal weight.).There were no vitals filed for this visit. No results for input(s): PHART, PO2ART in the last 72 hours.    Invalid input(s): PC02A                       MEDSenoxaparin, 40 mg, Subcutaneous, QDAY(21)  polyethylene glycol 3350, 1 packet, Oral, QDAY     IV MEDS  Prn ketorolac  (TORADOL) 15-30 mg injection Q6H PRN, LORazepam  (ATIVAN)  injection Q4H PRN, morphine  injection syringe Q4H PRN, oxyCODONE Q4H PRN, senna/docusate BID PRN     HOME MEDS   No current facility-administered medications on file prior to encounter.      Current Outpatient Medications on File Prior to Encounter   Medication Sig Dispense Refill   ? ALPRAZolam (XANAX) 1 mg tablet Take 1 mg by mouth three times daily as needed for Anxiety.     ? busPIRone (BUSPAR) 10 mg tablet Take 10 mg by mouth three times daily.     ? diazePAM (VALIUM) 5 mg tablet Take 5 mg by mouth twice daily as needed for Anxiety.     ? docusate (COLACE) 100 mg capsule Take 200 mg by mouth twice daily as needed for Constipation.     ? fluoxetine (PROZAC) 10 mg capsule Take 10 mg by mouth daily.     ? gabapentin (NEURONTIN) 300 mg capsule Take one capsule by mouth three times daily. Indications: Neuropathic Pain 180 capsule 0   ? omeprazole DR(+) (PRILOSEC) 20 mg capsule Take 20 mg by mouth daily before breakfast.     ? oxyCODONE (ROXICODONE, OXY-IR) 15 mg tablet Take one tablet by mouth every 4 hours as needed  0   ? oxycodone HCl (OXYCODONE PO) Take 7.5 mg by mouth every 6 hours as needed.     ? polyethylene glycol 3350 (MIRALAX) 17 g packet Take 17 g by mouth twice daily as needed.            Ct L-spine Wo Contrast    Result Date: 11/28/2019  1.  No acute fracture. 2.  Prior posterior decompression at L5-S1 with interbody spacer placed in posterior instrumented fusion at L5-S1. Hardware appears intact. There is no periprosthetic lucency to suggest loosening. No appreciable osseous endplate bridging about the interbody metallic cage spacer at L5-S1. 3. 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 By my electronic signature, I attest that I have personally reviewed the images for this examination and formulated the interpretations and opinions expressed in this report  Finalized by Donnald Garre on 11/28/2019 2:45 PM. Dictated by Elijah Birk, M.D. on 11/28/2019 2:03 PM.

## 2019-11-29 ENCOUNTER — Encounter: Admit: 2019-11-29 | Discharge: 2019-11-29

## 2019-11-29 LAB — COVID-19 (SARS-COV-2) PCR

## 2019-11-29 MED ORDER — LORAZEPAM 2 MG/ML IJ SOLN
1 mg | Freq: Once | INTRAVENOUS | 0 refills | Status: CP | PRN
Start: 2019-11-29 — End: ?
  Administered 2019-11-29: 08:00:00 1 mg via INTRAVENOUS

## 2019-11-29 MED ORDER — FENTANYL CITRATE (PF) 50 MCG/ML IJ SOLN
50 ug | Freq: Once | INTRAVENOUS | 0 refills | Status: CP
Start: 2019-11-29 — End: ?
  Administered 2019-11-29: 10:00:00 50 ug via INTRAVENOUS

## 2019-11-29 MED ORDER — OXYCODONE 10 MG PO TAB
10-20 mg | ORAL | 0 refills | Status: DC | PRN
Start: 2019-11-29 — End: 2019-12-02
  Administered 2019-11-30 – 2019-12-02 (×17): 20 mg via ORAL

## 2019-11-29 NOTE — Progress Notes
Patient arrived to room # [phone] via wheelchair accompanied by RN. Patient transferred to the bed without assistance. Bedside safety checks completed. Initial patient assessment completed. Refer to flowsheet for details.    Admission skin assessment completed with: Leia Alf, RN    Pressure injury present on arrival?: No    1. Head/Face/Neck: No  2. Trunk/Back: No  3. Upper Extremities: No  4. Lower Extremities: No  5. Pelvic/Coccyx: No  6. Assessed for device associated injury? Yes  7. Malnutrition Screening Tool (Nursing Nutrition Assessment) Completed? Yes    See Doc Flowsheet for additional wound details.     INTERVENTIONS:

## 2019-11-29 NOTE — Progress Notes
When patient arrived to the unit she was sobbing. She complained that her pain has not been well managed, pain was a 10 on the scale of 0-10. RN attempted to settle the patient but patient was saying she want to leave since "nothing' was being done to take care of her pain. Provider notified and orders were placed.     Since has been well settled since then, but still rating her pain at 10.

## 2019-11-30 ENCOUNTER — Encounter: Admit: 2019-11-30 | Discharge: 2019-11-30

## 2019-11-30 MED ORDER — ONDANSETRON HCL (PF) 4 MG/2 ML IJ SOLN
4 mg | INTRAVENOUS | 0 refills | Status: DC | PRN
Start: 2019-11-30 — End: 2019-12-02

## 2019-11-30 MED ORDER — GABAPENTIN 300 MG PO CAP
300 mg | ORAL | 0 refills | Status: DC
Start: 2019-11-30 — End: 2019-12-02
  Administered 2019-11-30 – 2019-12-02 (×5): 300 mg via ORAL

## 2019-12-01 ENCOUNTER — Inpatient Hospital Stay: Admit: 2019-12-01 | Discharge: 2019-12-01

## 2019-12-01 ENCOUNTER — Encounter: Admit: 2019-12-01 | Discharge: 2019-12-01

## 2019-12-01 DIAGNOSIS — N83209 Unspecified ovarian cyst, unspecified side: Secondary | ICD-10-CM

## 2019-12-01 MED ORDER — PROMETHAZINE 25 MG/ML IJ SOLN
6.25 mg | INTRAVENOUS | 0 refills | Status: DC | PRN
Start: 2019-12-01 — End: 2019-12-02

## 2019-12-01 MED ORDER — TIZANIDINE 4 MG PO TAB
2 mg | Freq: Three times a day (TID) | ORAL | 0 refills | Status: DC
Start: 2019-12-01 — End: 2019-12-02
  Administered 2019-12-01 – 2019-12-02 (×3): 2 mg via ORAL

## 2019-12-01 MED ORDER — FENTANYL CITRATE (PF) 50 MCG/ML IJ SOLN
25-50 ug | INTRAVENOUS | 0 refills | Status: DC | PRN
Start: 2019-12-01 — End: 2019-12-01

## 2019-12-01 MED ORDER — PROPOFOL INJ 10 MG/ML IV VIAL
0 refills | Status: DC
Start: 2019-12-01 — End: 2019-12-01
  Administered 2019-12-01: 14:00:00 200 mg via INTRAVENOUS

## 2019-12-01 MED ORDER — OXYCODONE 5 MG PO TAB
5-10 mg | Freq: Once | ORAL | 0 refills | Status: DC | PRN
Start: 2019-12-01 — End: 2019-12-01

## 2019-12-01 MED ORDER — HYDROMORPHONE (PF) 2 MG/ML IJ SYRG
.5-1 mg | INTRAVENOUS | 0 refills | Status: DC | PRN
Start: 2019-12-01 — End: 2019-12-01

## 2019-12-01 MED ORDER — FAMOTIDINE (PF) 20 MG/2 ML IV SOLN
0 refills | Status: DC
Start: 2019-12-01 — End: 2019-12-01
  Administered 2019-12-01: 14:00:00 20 mg via INTRAVENOUS

## 2019-12-01 MED ORDER — LIDOCAINE (PF) 200 MG/10 ML (2 %) IJ SYRG
0 refills | Status: DC
Start: 2019-12-01 — End: 2019-12-01
  Administered 2019-12-01: 14:00:00 80 mg via INTRAVENOUS

## 2019-12-01 MED ORDER — DIPHENHYDRAMINE HCL 50 MG/ML IJ SOLN
25 mg | Freq: Once | INTRAVENOUS | 0 refills | Status: DC | PRN
Start: 2019-12-01 — End: 2019-12-01

## 2019-12-01 MED ORDER — PHENYLEPHRINE HCL IN 0.9% NACL 1 MG/10 ML (100 MCG/ML) IV SYRG
INTRAVENOUS | 0 refills | Status: DC
Start: 2019-12-01 — End: 2019-12-01
  Administered 2019-12-01: 14:00:00 100 ug via INTRAVENOUS

## 2019-12-01 MED ORDER — LACTATED RINGERS IV SOLP
0 refills | Status: DC
Start: 2019-12-01 — End: 2019-12-01
  Administered 2019-12-01: 14:00:00 via INTRAVENOUS

## 2019-12-01 MED ORDER — HALOPERIDOL LACTATE 5 MG/ML IJ SOLN
1 mg | Freq: Once | INTRAVENOUS | 0 refills | Status: DC | PRN
Start: 2019-12-01 — End: 2019-12-01

## 2019-12-01 MED ORDER — MORPHINE 2 MG/ML IV SYRG
2 mg | INTRAVENOUS | 0 refills | Status: DC | PRN
Start: 2019-12-01 — End: 2019-12-02
  Administered 2019-12-01 – 2019-12-02 (×5): 2 mg via INTRAVENOUS

## 2019-12-01 MED ORDER — ONDANSETRON HCL (PF) 4 MG/2 ML IJ SOLN
INTRAVENOUS | 0 refills | Status: DC
Start: 2019-12-01 — End: 2019-12-01
  Administered 2019-12-01: 15:00:00 4 mg via INTRAVENOUS

## 2019-12-01 MED ORDER — MELOXICAM 15 MG PO TAB
15 mg | Freq: Every day | ORAL | 0 refills | Status: DC
Start: 2019-12-01 — End: 2019-12-02
  Administered 2019-12-01 – 2019-12-02 (×2): 15 mg via ORAL

## 2019-12-01 MED ORDER — ARTIFICIAL TEARS SINGLE DOSE DROPS GROUP
0 refills | Status: DC
Start: 2019-12-01 — End: 2019-12-01
  Administered 2019-12-01: 14:00:00 2 [drp] via OPHTHALMIC

## 2019-12-01 MED ORDER — GADOBENATE DIMEGLUMINE 529 MG/ML (0.1MMOL/0.2ML) IV SOLN
15 mL | Freq: Once | INTRAVENOUS | 0 refills | Status: CP
Start: 2019-12-01 — End: ?
  Administered 2019-12-01: 15:00:00 15 mL via INTRAVENOUS

## 2019-12-01 MED ORDER — CAMPHOR-MENTHOL 0.5-0.5 % TP LOTN
Freq: Three times a day (TID) | TOPICAL | 0 refills | Status: DC
Start: 2019-12-01 — End: 2019-12-02
  Administered 2019-12-01: 21:00:00 222.000 mL via TOPICAL

## 2019-12-01 NOTE — Anesthesia Post-Procedure Evaluation
Post-Anesthesia Evaluation    Name: Rhonda Prince      MRN: [id number]     DOB: 12/18/1985     Age: 34 y.o.     Sex: female   __________________________________________________________________________     Procedure Information     Anesthesia Start Date/Time: 12/01/19 0855    Procedure: MRI L-SPINE WO/W CONTRAST    Location: Imaging, MRI: Main Campus, [facility]          Post-Anesthesia Vitals  BP: 126/83 (03/15 1006)  Temp: 36.9 C (98.4 F) (03/15 1006)  Pulse: 119 (03/15 1006)  Respirations: 8 PER MINUTE (03/15 1006)  SpO2: 95 % (03/15 1006)  SpO2 Pulse: 120 (03/15 1006)   Vitals Value Taken Time   BP 126/83 12/01/19 1006   Temp 36.9 C (98.4 F) 12/01/19 1006   Pulse 119 12/01/19 1006   Respirations 8 PER MINUTE 12/01/19 1006   SpO2 95 % 12/01/19 1006         Post Anesthesia Evaluation Note    Evaluation location: Pre/Post  Patient participation: recovered; patient participated in evaluation  Level of consciousness: alert  Pain management: adequate    Hydration: normovolemia  Temperature: 36.0C - 38.4C  Airway patency: adequate    Perioperative Events       Post-op nausea and vomiting: no PONV    Postoperative Status  Cardiovascular status: hemodynamically stable  Respiratory status: spontaneous ventilation  Follow-up needed: none        Perioperative Events  Perioperative Event: No  Emergency Case Activation: No

## 2019-12-02 MED ORDER — MELOXICAM 15 MG PO TAB
ORAL_TABLET | Freq: Every day | ORAL | 0 refills | 30.00000 days | Status: AC
Start: 2019-12-02 — End: ?

## 2019-12-02 MED ORDER — TIZANIDINE 2 MG PO TAB
2 mg | ORAL_TABLET | Freq: Three times a day (TID) | ORAL | 0 refills | Status: AC
Start: 2019-12-02 — End: ?

## 2019-12-10 ENCOUNTER — Encounter: Admit: 2019-12-10 | Discharge: 2019-12-10

## 2019-12-10 ENCOUNTER — Emergency Department: Admit: 2019-12-10 | Discharge: 2019-12-10

## 2019-12-10 ENCOUNTER — Emergency Department: Admit: 2019-12-10 | Discharge: 2019-12-10 | Payer: MEDICAID

## 2019-12-10 DIAGNOSIS — M7918 Myalgia, other site: Secondary | ICD-10-CM

## 2019-12-10 DIAGNOSIS — N83209 Unspecified ovarian cyst, unspecified side: Secondary | ICD-10-CM

## 2019-12-10 LAB — POC TROPONIN: Lab: 0 ng/mL (ref 0.00–0.05)

## 2019-12-10 MED ORDER — KETOROLAC 15 MG/ML IJ SOLN
15 mg | Freq: Once | INTRAVENOUS | 0 refills | Status: CP
Start: 2019-12-10 — End: ?
  Administered 2019-12-10: 17:00:00 15 mg via INTRAVENOUS

## 2019-12-10 NOTE — ED Notes
Discharge instructions discussed with Pt. Pt verbalized understanding to follow up with PCP and come to ED if symptoms worsen. No further questions or concerns at this time. Pt alert & oriented. Pt ambulated with steady gait out of department with all of pt belongings. Pt denied need for AVS paperwork.

## 2019-12-10 NOTE — ED Provider Notes
Rhonda Prince is a 34 y.o. female.    Chief Complaint:  Chief Complaint   Patient presents with   ? Chest Pain     CP that radiates to R back x 3 days       History of Present Illness:  Rhonda Prince is a 34 y.o. female, with a history of chronic back pain s/p fusion L5-S1, tachycardia, GERD, and anxiety who presents to the emergency department for chest pain. Patient with complaints of substernal chest pain that radiates to the right chest/back. Since onset 3 days ago, the pain has been constant, pressure, moderate. Denies any diaphoresis, nausea, or atypical shortness of breath. Denies any aggravating factors. Patient reports taking percocet 10mg  and 81mg  ASA with no relief noted. Confirms tobacco use; down to 0.25ppd. Denies etoh use. Denies use of illicit substances. Current medications include: metoprolol for sinus tachycardia, prilosec, percocet, and valium 5mg .       History provided by:  Patient  Language interpreter used: No        Review of Systems:  Review of Systems   Constitutional: Negative for activity change, chills, fatigue and fever.   HENT: Negative for congestion.    Eyes: Negative for visual disturbance.   Respiratory: Negative for cough and shortness of breath.    Cardiovascular: Positive for chest pain.   Gastrointestinal: Negative for abdominal pain, diarrhea, nausea and vomiting.   Genitourinary: Negative for difficulty urinating.   Musculoskeletal: Positive for myalgias.   Skin: Negative for rash.   Neurological: Negative for headaches.   Psychiatric/Behavioral: Negative for confusion.       Allergies:  Pcn [penicillins]    Past Medical History:  Medical History:   Diagnosis Date   ? Ovarian cyst        Past Surgical History:  Surgical History:   Procedure Laterality Date   ? HX APPENDECTOMY     ? HX HYSTERECTOMY         Pertinent medical/surgical history reviewed  Medical History:   Diagnosis Date   ? Ovarian cyst      Surgical History:   Procedure Laterality Date   ? HX APPENDECTOMY     ? HX HYSTERECTOMY         Social History:  Social History     Tobacco Use   ? Smoking status: Current Every Day Smoker     Packs/day: 1.50     Years: 11.00     Pack years: 16.50     Types: Cigarettes   ? Smokeless tobacco: Never Used   Substance Use Topics   ? Alcohol use: No   ? Drug use: No     Social History     Substance and Sexual Activity   Drug Use No       Family History:  No family history on file.    Vitals:  ED Vitals    Date and Time T BP P RR SPO2P SPO2 User   12/10/19 1258 -- -- 84 10 PER MINUTE 85 98 % SS   12/10/19 1248 -- -- 74 -- 75 100 % SS   12/10/19 1247 -- 147/97 -- -- -- -- SS   12/10/19 1202 -- 155/103 78 10 PER MINUTE 78 99 % SS   12/10/19 1145 37 ?C (98.6 ?F) 153/113 98 16 PER MINUTE -- 99 % NH          Physical Exam:  Physical Exam  Vitals signs and nursing note reviewed.   Constitutional:  General: She is not in acute distress.     Appearance: She is well-developed. She is obese. She is not ill-appearing or diaphoretic.   HENT:      Head: Normocephalic and atraumatic.   Eyes:      General: No scleral icterus.     Extraocular Movements: Extraocular movements intact.   Neck:      Musculoskeletal: Normal range of motion and neck supple.   Cardiovascular:      Rate and Rhythm: Normal rate and regular rhythm.      Pulses: Normal pulses.      Heart sounds: Normal heart sounds. No murmur.   Pulmonary:      Effort: Pulmonary effort is normal. No respiratory distress.      Breath sounds: Normal breath sounds.   Chest:      Chest wall: Tenderness (right anterior chest wall) present.   Abdominal:      General: Bowel sounds are normal. There is no distension.      Palpations: Abdomen is soft.      Tenderness: There is no abdominal tenderness. There is no right CVA tenderness or left CVA tenderness.   Musculoskeletal:      Right shoulder: She exhibits tenderness (right trapezius).      Cervical back: She exhibits normal range of motion, no bony tenderness and no pain.      Right lower leg: No edema.      Left lower leg: No edema.      Comments: Shoulders are symmetric.   Skin:     General: Skin is warm and dry.   Neurological:      Mental Status: She is alert and oriented to person, place, and time. Mental status is at baseline.   Psychiatric:         Mood and Affect: Mood normal.         Behavior: Behavior normal.         Laboratory Results:  Labs Reviewed   POC TROPONIN       Result Value Ref Range Status    Troponin-I-POC 0.00  0.00 - 0.05 NG/ML Final   BLUE TOP TUBE   MINT TOP TUBE   LAVENDER TOP TUBE   URINE TIGER TOP TUBE   URINE CLEAR TOP TUBE   EXTRA URINE GRAY TOP   POC TROPONIN   POC URINE PREGNANCY     Urine Pregnancy  Urine Pregnancy: (S) Negative  QC: Acceptable  Urine Pregnancy Lot #: [id number]    Radiology Interpretation:    CHEST SINGLE VIEW   Final Result      Left basal linear atelectasis or scarring without consolidation.          Finalized by Delmar Landau, M.D. on 12/10/2019 12:30 PM. Dictated by Delmar Landau, M.D. on 12/10/2019 12:29 PM.               EKG:    12 Lead  SR  Rate 92  QTc 438  No STEMI    ED Course:  This patient was seen by C. , APRN.    This is a 34 year old female who presents in the care of a friend complaining of mid anterior chest pain which radiates to the right anterior chest and right upper back x3 days.  She specifically denies associated nausea, diaphoresis, and dyspnea.  She states her symptoms have been constant.    Physical exam is remarkable for anxious affect, reproducible pain with palpation of the right anterior chest and right  trapezius muscle, otherwise normal.  Vital signs reviewed demonstrate elevated blood pressure.    Toradol 15 mg IV once given for pain in the ED.  EKG performed and as noted above.  Troponin ordered and is 0.  Single view chest x-ray ordered and reviewed; negative for acute findings.    All results reviewed with the patient.  The patient requested something stronger for pain, however explained the patient is under pain contract and she would not be getting any controlled substances during this ED visit she verbalized understanding.  Return emergency precautions given.  The patient was discharged in stable condition in the care of her friend with the following instructions:  You were seen today for chest and upper back pain.  Your physical exam is consistent with musculoskeletal pain and your cardiac work-up is negative at this time.  Continue all pain medications as prescribed by your pain management provider.  Moist heat may also provide additional relief.  Return the emergency room for fever, difficulty breathing, uncontrolled vomiting, or new concerning symptoms.       ED Scoring:                      HEART Total Score: 1  Low (0-3 points), with 0.9-1.7% risk over the next 6 weeks.  Moderate (4-7 points), with 12.0-16.6% risk over the next 6 weeks.  High (8-10 points), with 5--65% risk over the next 6 weeks.      MDM  Reviewed: vitals, nursing note and previous chart  Reviewed previous: labs and ECG  Interpretation: labs, ECG and x-ray        Facility Administered Meds:  Medications   ketorolac (TORADOL) injection 15 mg (15 mg Intravenous Given 12/10/19 1225)         Clinical Impression:  Clinical Impression   Musculoskeletal pain       Disposition/Follow up  ED Disposition     ED Disposition    Discharge        Chaney Born, MD  [address]  [place] [address]  [phone]    In 1 week      Emergency Department: [facility]  [address]  [address]  [place] [address]  [phone]    As needed      Medications:  Discharge Medication List as of 12/10/2019 12:59 PM          Procedure Notes:  Procedures      Attestation / Supervision:  Jeronimo Norma, am scribing for and in the presence of Zenovia Jarred, APRN.      Daryel November    Attestation / Supervision Note concerning Shahidah Nesbitt: The service was provided by the APP alone with immediate availability of a physician in the ED. and I, Yvetta Coder, APRN-NP, personally performed the services described in this documentation as scribed in my presence and it is both accurate and complete.    Yvetta Coder, APRN-NP

## 2019-12-10 NOTE — ED Notes
X ray at bedside

## 2019-12-10 NOTE — ED Notes
Pt ambulated to restroom independently to provide urine sample with steady gait

## 2020-02-07 ENCOUNTER — Emergency Department: Admit: 2020-02-07 | Discharge: 2020-02-07

## 2020-02-07 ENCOUNTER — Encounter: Admit: 2020-02-07 | Discharge: 2020-02-07

## 2020-02-07 ENCOUNTER — Emergency Department: Admit: 2020-02-07 | Discharge: 2020-02-08 | Payer: MEDICAID

## 2020-02-07 DIAGNOSIS — M25551 Pain in right hip: Secondary | ICD-10-CM

## 2020-02-07 LAB — COMPREHENSIVE METABOLIC PANEL
Lab: 0.3 mg/dL (ref 0.3–1.2)
Lab: 0.6 mg/dL (ref 0.4–1.00)
Lab: 102 MMOL/L (ref 98–110)
Lab: 105 U/L (ref 25–110)
Lab: 11 10*3/uL — ABNORMAL HIGH (ref 3–12)
Lab: 139 MMOL/L (ref 137–147)
Lab: 14 U/L (ref 7–40)
Lab: 14 U/L (ref 7–56)
Lab: 26 MMOL/L (ref 21–30)
Lab: 3.9 MMOL/L (ref 3.5–5.1)
Lab: 4.1 g/dL (ref 3.5–5.0)
Lab: 7 mg/dL (ref 7–25)
Lab: 83 mg/dL (ref 70–100)
Lab: 9.2 mg/dL (ref 8.5–10.6)
Lab: >60 mL/min (ref >60)
Lab: >60 mL/min (ref >60)

## 2020-02-07 LAB — CBC AND DIFF
Lab: 0.1 10*3/uL (ref 0–0.20)
Lab: 0.1 10*3/uL (ref 0–0.45)
Lab: 13 K/UL — ABNORMAL HIGH (ref >60)
Lab: 18 (ref <20.7)

## 2020-02-07 MED ORDER — MORPHINE 2 MG/ML IV SYRG
4 mg | Freq: Once | INTRAVENOUS | 0 refills | Status: DC
Start: 2020-02-07 — End: 2020-02-07

## 2020-02-07 MED ORDER — SODIUM CHLORIDE 0.9 % IV SOLP
1000 mL | INTRAVENOUS | 0 refills | Status: CP
Start: 2020-02-07 — End: ?
  Administered 2020-02-07: 22:00:00 1000 mL via INTRAVENOUS

## 2020-02-07 MED ORDER — METHOCARBAMOL 750 MG PO TAB
750 mg | ORAL_TABLET | Freq: Four times a day (QID) | ORAL | 0 refills | Status: AC
Start: 2020-02-07 — End: ?

## 2020-02-07 MED ORDER — ACETAMINOPHEN 500 MG PO TAB
1000 mg | Freq: Once | ORAL | 0 refills | Status: CP
Start: 2020-02-07 — End: ?
  Administered 2020-02-07: 22:00:00 1000 mg via ORAL

## 2020-02-07 MED ORDER — METHOCARBAMOL 750 MG PO TAB
750 mg | Freq: Once | ORAL | 0 refills | Status: CP
Start: 2020-02-07 — End: ?
  Administered 2020-02-07: 22:00:00 750 mg via ORAL

## 2020-02-07 MED ORDER — NAPROXEN 500 MG PO TAB
500 mg | Freq: Once | ORAL | 0 refills | Status: CP
Start: 2020-02-07 — End: ?
  Administered 2020-02-07: 22:00:00 500 mg via ORAL

## 2020-02-07 MED ORDER — MORPHINE 2 MG/ML IV SYRG
4 mg | Freq: Once | INTRAVENOUS | 0 refills | Status: CP
Start: 2020-02-07 — End: ?
  Administered 2020-02-07: 23:00:00 4 mg via INTRAVENOUS

## 2020-02-07 MED ORDER — HALOPERIDOL LACTATE 5 MG/ML IJ SOLN
2 mg | Freq: Once | INTRAVENOUS | 0 refills | Status: CP
Start: 2020-02-07 — End: ?
  Administered 2020-02-07: 22:00:00 2 mg via INTRAVENOUS

## 2020-02-07 NOTE — ED Notes
Pt ambulated to the bathroom with steady gait accompanied by family to provide urine sample.

## 2020-02-07 NOTE — ED Provider Notes
Rhonda Prince is a 34 y.o. female.    Chief Complaint:  Chief Complaint   Patient presents with   ? Hip Pain     Had MRI done in september and was told that she does not have sufficient blood flow to her RLE. Pt states that she has been doing injections but is scheduled to have a surgery this Wednesday. Pt unsure what kind of procedure, states there is no blood clot. Pt denies any trauma or injury. Since pt's pain has been worsening and pt unable to perform ADLs Dr. recommended she come to ER.        History of Present Illness:  Sets in SeptemberPatient is a 34 year old female with history of chronic back pain, chronic hip pain who presents to the ED with acute on chronic right hip pain.,  She has had progressive pain in her right hip.  She had an MRI done in that right hip in September 2020 however was only recently told about those results that were concerning for insufficient blood flow within her right hip however unsure what it was called.  She is scheduled for surgery on upcoming Wednesday.  Patient states she ambulates using a cane/walker however has been having frequent falls because of her right leg gives out.  She denies landing on her right hip, hitting her head or passing out.  Patient states she takes 10 mg of oxycodone every 4?6 hours for her pain with last dose at 0930.  She states that she is has been having trouble with keeping the pain under control this week and therefore came to the ED for further evaluation.  She denies any fevers and states that currently, her back pain is not bothering her as much as her right hip pain.  She denies weakness, numbness or tingling in her right leg.  She denies any bladder or bowel incontinence.      History provided by:  Patient, medical records and parent  Language interpreter used: No        Review of Systems:  Review of Systems   Constitutional: Negative for fever.   HENT: Negative for congestion.    Eyes: Negative for visual disturbance.   Respiratory: Negative for cough and shortness of breath.    Cardiovascular: Negative for chest pain and leg swelling.   Gastrointestinal: Negative for abdominal pain, diarrhea, nausea and vomiting.   Genitourinary: Negative for difficulty urinating and dysuria.   Musculoskeletal: Negative for back pain.        R hip pain   Skin: Negative for rash.   Neurological: Positive for weakness (RLEx intermittent). Negative for dizziness, numbness and headaches.       Allergies:  Pcn [penicillins]    Past Medical History:  Medical History:   Diagnosis Date   ? Ovarian cyst        Past Surgical History:  Surgical History:   Procedure Laterality Date   ? HX APPENDECTOMY     ? HX HYSTERECTOMY         Pertinent medical/surgical history reviewed    Social History:  Social History     Tobacco Use   ? Smoking status: Current Every Day Smoker     Packs/day: 1.50     Years: 11.00     Pack years: 16.50     Types: Cigarettes   ? Smokeless tobacco: Never Used   Substance Use Topics   ? Alcohol use: No   ? Drug use: No     Social  History     Substance and Sexual Activity   Drug Use No             Family History:  No family history on file.    Vitals:  ED Vitals    Date and Time T BP P RR SPO2P SPO2 User   02/07/20 1542 -- -- -- -- 98 99 % JL   02/07/20 1426 36.9 ?C (98.4 ?F) 136/99 115 18 PER MINUTE -- 98 % JL          Physical Exam:  Physical Exam  Vitals and nursing note reviewed.   Constitutional:       General: She is awake. She is in acute distress.      Appearance: Normal appearance. She is well-developed. She is obese. She is not ill-appearing.   HENT:      Head: Normocephalic and atraumatic.      Right Ear: External ear normal.      Left Ear: External ear normal.      Nose: Nose normal.   Eyes:      General: Lids are normal.      Conjunctiva/sclera: Conjunctivae normal.   Cardiovascular:      Rate and Rhythm: Normal rate and regular rhythm.      Pulses: Normal pulses.           Dorsalis pedis pulses are 2+ on the right side and 2+ on the left side.        Posterior tibial pulses are 2+ on the right side and 2+ on the left side.   Pulmonary:      Effort: Pulmonary effort is normal. No respiratory distress.      Breath sounds: Normal breath sounds and air entry. No wheezing, rhonchi or rales.   Abdominal:      General: There is no distension.      Palpations: Abdomen is soft.      Tenderness: There is no abdominal tenderness. There is no guarding or rebound.   Musculoskeletal:         General: No deformity or signs of injury. Normal range of motion.      Cervical back: Normal range of motion and neck supple.      Right hip: Tenderness and bony tenderness present.      Right lower leg: No edema.      Left lower leg: No edema.   Skin:     General: Skin is warm and dry.   Neurological:      Mental Status: She is alert and oriented to person, place, and time. Mental status is at baseline.   Psychiatric:         Mood and Affect: Mood normal.         Behavior: Behavior is cooperative.       Laboratory Results:  Labs Reviewed   CBC AND DIFF   COMPREHENSIVE METABOLIC PANEL   URINE TIGER TOP TUBE   URINE CLEAR TOP TUBE   EXTRA URINE GRAY TOP   POC URINE PREGNANCY          Radiology Interpretation:  HIP 2-3 VIEWS W PELVIS RT    (Results Pending)     EKG:  n/a    ED Course:  ED Course as of Feb 06 1641   Sat Feb 07, 2020   1608 Patient is a 34 year old female with history of chronic back pain and hip pain who presents to the ED with acute on chronic right hip pain.  Previous records reviewed.  Vitals notable for elevated blood pressures and tachycardia, likely in the context of pain.  Differential diagnosis includes but not limited to fracture.  Labs and x-rays obtained for further evaluation.  She was started on IV fluids and administered Tylenol, Haldol, Robaxin and naproxen for symptomatic relief.    [DR]      ED Course User Index  [DR] Dola Factor, MD       ED Scoring:  n/a    MDM  Reviewed: nursing note and vitals  Interpretation: x-ray and labs      Facility Administered Meds:  Medications   methocarbamoL (ROBAXIN) tablet 750 mg (750 mg Oral Given 02/07/20 1642)   naproxen (NAPROSYN) tablet 500 mg (500 mg Oral Given 02/07/20 1642)   haloperidol lactate (HALDOL) injection 2 mg (2 mg Intravenous Given 02/07/20 1642)   acetaminophen (TYLENOL) tablet 1,000 mg (1,000 mg Oral Given 02/07/20 1642)   sodium chloride 0.9 %   infusion (1,000 mL Intravenous Given - New Bag 02/07/20 1640)         Clinical Impression:  Clinical Impression   None       Disposition/Follow up  ED Disposition     None        No follow-up provider specified.    Medications:  New Prescriptions    No medications on file       Procedure Notes:  Procedures      Attestation / Supervision:  Dola Factor, MD

## 2020-02-07 NOTE — ED Notes
Pt presents to ED23 with complaints of right hip pain. Pt states that she has a hx of lower back pain and had an mri done in September 2020. Pt states that she has been doing some type of injections since September, but met with a surgeon to have a procedure done on Thursday because there isn't enough blood flow to my leg. Pt states there are not any clots and she does not have any fractures or trauma to her right hip. Pt states the procedure might place rods and screws to help the blood flow. When asked pt's specific diagnosed, pt states I don't understand how doctors talk so I don't know what's going on. Monitors applied and VSS on RA. Pulses palpated in RLE, color/temperature appropriate, no signs of any deformity. Bed in the lowest locked position and call light within reach.

## 2020-02-08 NOTE — ED Notes
DC instructions reviewed with pt and pt verbalizes understanding. PIV removed and pt discharged ambulating independently to POV accompanied by family.

## 2020-04-27 ENCOUNTER — Encounter: Admit: 2020-04-27 | Discharge: 2020-04-27

## 2020-04-28 ENCOUNTER — Emergency Department: Admit: 2020-04-28 | Discharge: 2020-04-27 | Payer: MEDICAID

## 2020-07-11 ENCOUNTER — Encounter: Admit: 2020-07-11 | Discharge: 2020-07-11

## 2020-07-11 ENCOUNTER — Inpatient Hospital Stay: Admit: 2020-07-11 | Payer: MEDICAID

## 2020-07-11 ENCOUNTER — Emergency Department: Admit: 2020-07-11 | Discharge: 2020-07-11

## 2020-07-11 DIAGNOSIS — M545 Acute right-sided low back pain without sciatica: Principal | ICD-10-CM

## 2020-07-11 MED ORDER — ENOXAPARIN 40 MG/0.4 ML SC SYRG
40 mg | Freq: Every day | SUBCUTANEOUS | 0 refills
Start: 2020-07-11 — End: ?
  Administered 2020-07-13 – 2020-07-14 (×2): 40 mg via SUBCUTANEOUS

## 2020-07-11 MED ORDER — MORPHINE 2 MG/ML IV SYRG
4 mg | Freq: Once | INTRAVENOUS | 0 refills | Status: DC
Start: 2020-07-11 — End: 2020-07-12

## 2020-07-11 MED ORDER — POLYETHYLENE GLYCOL 3350 17 GRAM PO PWPK
1 | Freq: Every day | ORAL | 0 refills | PRN
Start: 2020-07-11 — End: ?

## 2020-07-11 MED ORDER — FENTANYL CITRATE (PF) 50 MCG/ML IJ SOLN
25-50 ug | INTRAVENOUS | 0 refills | Status: AC | PRN
Start: 2020-07-11 — End: ?
  Administered 2020-07-12 (×3): 50 ug via INTRAVENOUS

## 2020-07-11 MED ORDER — GABAPENTIN 300 MG PO CAP
300 mg | Freq: Three times a day (TID) | ORAL | 0 refills
Start: 2020-07-11 — End: ?
  Administered 2020-07-12: 14:00:00 300 mg via ORAL

## 2020-07-11 MED ORDER — ONDANSETRON 4 MG PO TBDI
4 mg | ORAL | 0 refills | PRN
Start: 2020-07-11 — End: ?

## 2020-07-11 MED ORDER — BISACODYL 10 MG RE SUPP
10 mg | Freq: Every day | RECTAL | 0 refills | PRN
Start: 2020-07-11 — End: ?

## 2020-07-11 MED ORDER — METHOCARBAMOL 750 MG PO TAB
750 mg | Freq: Four times a day (QID) | ORAL | 0 refills
Start: 2020-07-11 — End: ?
  Administered 2020-07-12 – 2020-07-14 (×9): 750 mg via ORAL

## 2020-07-11 MED ORDER — MELATONIN 3 MG PO TAB
3 mg | Freq: Every evening | ORAL | 0 refills | PRN
Start: 2020-07-11 — End: ?

## 2020-07-11 MED ORDER — ACETAMINOPHEN 325 MG PO TAB
650 mg | ORAL | 0 refills | PRN
Start: 2020-07-11 — End: ?
  Administered 2020-07-12: 06:00:00 650 mg via ORAL

## 2020-07-11 MED ORDER — TIZANIDINE 4 MG PO TAB
2 mg | Freq: Three times a day (TID) | ORAL | 0 refills
Start: 2020-07-11 — End: ?

## 2020-07-11 MED ORDER — PANTOPRAZOLE 40 MG PO TBEC
40 mg | Freq: Every day | ORAL | 0 refills
Start: 2020-07-11 — End: ?
  Administered 2020-07-12 – 2020-07-14 (×3): 40 mg via ORAL

## 2020-07-11 MED ORDER — FENTANYL CITRATE (PF) 50 MCG/ML IJ SOLN
50 ug | Freq: Once | INTRAVENOUS | 0 refills | Status: CP
Start: 2020-07-11 — End: ?
  Administered 2020-07-11: 50 ug via INTRAVENOUS

## 2020-07-11 MED ORDER — ONDANSETRON HCL (PF) 4 MG/2 ML IJ SOLN
4 mg | INTRAVENOUS | 0 refills | PRN
Start: 2020-07-11 — End: ?

## 2020-07-11 MED ORDER — LIDOCAINE 5 % TP PTMD
1 | Freq: Once | TOPICAL | 0 refills | Status: AC
Start: 2020-07-11 — End: ?
  Administered 2020-07-11: 1 via TOPICAL

## 2020-07-11 MED ORDER — OXYCODONE 5 MG PO TAB
5-15 mg | ORAL | 0 refills | Status: AC | PRN
Start: 2020-07-11 — End: ?
  Administered 2020-07-12 (×3): 15 mg via ORAL

## 2020-07-11 MED ORDER — LACTATED RINGERS IV SOLP
1000 mL | INTRAVENOUS | 0 refills | Status: CP
Start: 2020-07-11 — End: ?

## 2020-07-11 MED ORDER — MORPHINE 2 MG/ML IV SYRG
4 mg | Freq: Once | INTRAVENOUS | 0 refills | Status: CP
Start: 2020-07-11 — End: ?
  Administered 2020-07-12: 02:00:00 4 mg via INTRAVENOUS

## 2020-07-11 NOTE — ED Notes
34 yr old female pt presents to ED w/ cc of back pain. Pt had sacral joint fusion on Thursday at clinic. Ever since I have had such bad pain! Pt has had 4 back surgeries. Pt denies n/v/d, numbness or tingling in extremities, abdominal pain, headache, sore throat, cough, fever, chills, lightheaded, dizzy, pain when peeing, constipation, vision changes, chest pain, sob. Pt is A&Ox4, bed rails up, bed at lowest positoin, call light within reach, hypertensive, VSS, even & unlabored breathing, calm & cooperative.     Hx:   Medical History:   Diagnosis Date   ? Ovarian cyst      Belongings: shirt, pants, shoes x2, cell phone.

## 2020-07-12 ENCOUNTER — Encounter: Admit: 2020-07-12 | Discharge: 2020-07-12

## 2020-07-12 DIAGNOSIS — N83209 Unspecified ovarian cyst, unspecified side: Secondary | ICD-10-CM

## 2020-07-12 LAB — CBC AND DIFF
Lab: 0 10*3/uL (ref 0–0.45)
Lab: 0.1 10*3/uL (ref 0–0.20)
Lab: 17 10*3/uL — ABNORMAL HIGH (ref 4.5–11.0)
Lab: 19 (ref <20.7)

## 2020-07-12 LAB — URINALYSIS MICROSCOPIC REFLEX TO CULTURE

## 2020-07-12 LAB — SED RATE: Lab: 24 mm/h — ABNORMAL HIGH (ref 0–20)

## 2020-07-12 LAB — URINALYSIS DIPSTICK REFLEX TO CULTURE
Lab: NEGATIVE MMOL/L (ref 21–30)
Lab: NEGATIVE U/L (ref 7–40)
Lab: NEGATIVE g/dL (ref 3.5–5.0)
Lab: NEGATIVE g/dL — ABNORMAL HIGH (ref 6.0–8.0)
Lab: NEGATIVE mg/dL (ref 0.3–1.2)

## 2020-07-12 LAB — COMPREHENSIVE METABOLIC PANEL
Lab: 12 10*3/uL — ABNORMAL HIGH (ref 3–12)
Lab: 141 MMOL/L — ABNORMAL HIGH (ref 137–147)
Lab: >60 mL/min — ABNORMAL HIGH (ref >60)

## 2020-07-12 LAB — C REACTIVE PROTEIN (CRP): Lab: 0.9 mg/dL (ref <1.0)

## 2020-07-12 LAB — COVID-19 (SARS-COV-2) PCR

## 2020-07-12 MED ADMIN — ACETAMINOPHEN 500 MG PO TAB [102]: 1000 mg | ORAL | @ 12:00:00 | NDC 00904673061

## 2020-07-12 MED ADMIN — KETOROLAC 30 MG/ML (1 ML) IJ SOLN [22473]: 30 mg | INTRAVENOUS | @ 14:00:00 | Stop: 2020-07-12 | NDC 72611072201

## 2020-07-12 MED ADMIN — MORPHINE 15 MG PO TAB [5178]: 15 mg | ORAL | @ 20:00:00 | Stop: 2020-07-12 | NDC 00054023524

## 2020-07-12 MED ADMIN — MORPHINE 15 MG PO TAB [5178]: 15 mg | ORAL | @ 15:00:00 | Stop: 2020-07-12 | NDC 00054023524

## 2020-07-12 MED ADMIN — MORPHINE 4 MG/ML IV SYRG [320654]: 4 mg | INTRAVENOUS | @ 17:00:00 | Stop: 2020-07-12 | NDC 00409189103

## 2020-07-12 MED ADMIN — HYDROXYZINE HCL 25 MG PO TAB [3774]: 25 mg | ORAL | @ 17:00:00 | NDC 00904661761

## 2020-07-12 MED ADMIN — FLU VACC QS2021-22 6MOS UP(PF) 60 MCG (15 MCG X 4)/0.5 ML IM SYRG [457010]: 0.5 mL | INTRAMUSCULAR | @ 23:00:00 | Stop: 2020-07-12 | NDC 19515081841

## 2020-07-12 MED ADMIN — ACETAMINOPHEN 500 MG PO TAB [102]: 1000 mg | ORAL | @ 19:00:00 | NDC 00904673061

## 2020-07-12 MED ADMIN — MORPHINE 4 MG/ML IV SYRG [320654]: 4 mg | INTRAVENOUS | @ 01:00:00 | Stop: 2020-07-12 | NDC 00641612501

## 2020-07-12 MED ADMIN — PREGABALIN 75 MG PO CAP [94904]: 75 mg | ORAL | @ 15:00:00 | NDC 00904700061

## 2020-07-12 MED ADMIN — MORPHINE 15 MG PO TAB [5178]: 15 mg | ORAL | NDC 00054023524

## 2020-07-12 MED ADMIN — MORPHINE 4 MG/ML IV SYRG [320654]: 4 mg | INTRAVENOUS | @ 21:00:00 | Stop: 2020-07-12 | NDC 00409189103

## 2020-07-12 NOTE — ED Notes
Pt walked to BR with steady gait.

## 2020-07-12 NOTE — ED Notes
Report to Molly, RN.

## 2020-07-13 ENCOUNTER — Inpatient Hospital Stay: Admit: 2020-07-13 | Discharge: 2020-07-13

## 2020-07-13 MED ORDER — FENTANYL CITRATE (PF) 50 MCG/ML IJ SOLN
INTRAVENOUS | 0 refills | Status: DC
Start: 2020-07-13 — End: 2020-07-13
  Administered 2020-07-13: 15:00:00 100 ug via INTRAVENOUS

## 2020-07-13 MED ORDER — KETAMINE 10 MG/ML IJ SOLN
INTRAVENOUS | 0 refills | Status: DC
Start: 2020-07-13 — End: 2020-07-13
  Administered 2020-07-13: 15:00:00 30 mg via INTRAVENOUS

## 2020-07-13 MED ORDER — LACTATED RINGERS IV SOLP
INTRAVENOUS | 0 refills | Status: DC
Start: 2020-07-13 — End: 2020-07-13
  Administered 2020-07-13: 15:00:00 via INTRAVENOUS

## 2020-07-13 MED ORDER — ARTIFICIAL TEARS SINGLE DOSE DROPS GROUP
OPHTHALMIC | 0 refills | Status: DC
Start: 2020-07-13 — End: 2020-07-13
  Administered 2020-07-13: 15:00:00 2 [drp] via OPHTHALMIC

## 2020-07-13 MED ORDER — LIDOCAINE (PF) 200 MG/10 ML (2 %) IJ SYRG
INTRAVENOUS | 0 refills | Status: DC
Start: 2020-07-13 — End: 2020-07-13
  Administered 2020-07-13: 15:00:00 100 mg via INTRAVENOUS

## 2020-07-13 MED ORDER — ONDANSETRON HCL (PF) 4 MG/2 ML IJ SOLN
INTRAVENOUS | 0 refills | Status: DC
Start: 2020-07-13 — End: 2020-07-13
  Administered 2020-07-13: 15:00:00 4 mg via INTRAVENOUS

## 2020-07-13 MED ORDER — MIDAZOLAM 1 MG/ML IJ SOLN
INTRAVENOUS | 0 refills | Status: DC
Start: 2020-07-13 — End: 2020-07-13
  Administered 2020-07-13: 15:00:00 2 mg via INTRAVENOUS

## 2020-07-13 MED ORDER — PROPOFOL INJ 10 MG/ML IV VIAL
INTRAVENOUS | 0 refills | Status: DC
Start: 2020-07-13 — End: 2020-07-13
  Administered 2020-07-13: 15:00:00 200 mg via INTRAVENOUS

## 2020-07-13 MED ADMIN — MORPHINE 15 MG PO TAB [5178]: 15 mg | ORAL | @ 10:00:00 | NDC 00054023524

## 2020-07-13 MED ADMIN — MORPHINE 4 MG/ML IV SYRG [320654]: 4 mg | INTRAVENOUS | @ 04:00:00 | NDC 00409189103

## 2020-07-13 MED ADMIN — ACETAMINOPHEN 500 MG PO TAB [102]: 1000 mg | ORAL | @ 03:00:00 | NDC 00904673061

## 2020-07-13 MED ADMIN — ACETAMINOPHEN 500 MG PO TAB [102]: 1000 mg | ORAL | @ 19:00:00 | NDC 00904673061

## 2020-07-13 MED ADMIN — MORPHINE 4 MG/ML IV SYRG [320654]: 4 mg | INTRAVENOUS | NDC 00641612501

## 2020-07-13 MED ADMIN — MORPHINE 4 MG/ML IV SYRG [320654]: 4 mg | INTRAVENOUS | @ 21:00:00 | NDC 00641612501

## 2020-07-13 MED ADMIN — MORPHINE 15 MG PO TAB [5178]: 15 mg | ORAL | @ 23:00:00 | NDC 00054023524

## 2020-07-13 MED ADMIN — MORPHINE 4 MG/ML IV SYRG [320654]: 4 mg | INTRAVENOUS | NDC 00409189103

## 2020-07-13 MED ADMIN — MORPHINE 15 MG PO TAB [5178]: 15 mg | ORAL | @ 19:00:00 | NDC 00054023524

## 2020-07-13 MED ADMIN — PREGABALIN 75 MG PO CAP [94904]: 75 mg | ORAL | @ 19:00:00 | NDC 00904700061

## 2020-07-13 MED ADMIN — HYDROMORPHONE (PF) 2 MG/ML IJ SYRG [163476]: 0.5 mg | INTRAVENOUS | @ 16:00:00 | Stop: 2020-07-13 | NDC 63323085303

## 2020-07-13 MED ADMIN — MORPHINE 4 MG/ML IV SYRG [320654]: 4 mg | INTRAVENOUS | @ 11:00:00 | NDC 00409189103

## 2020-07-13 MED ADMIN — MORPHINE 15 MG PO TAB [5178]: 15 mg | ORAL | @ 03:00:00 | NDC 00054023524

## 2020-07-13 MED ADMIN — HYDROMORPHONE (PF) 2 MG/ML IJ SYRG [163476]: 0.5 mg | INTRAVENOUS | @ 17:00:00 | Stop: 2020-07-13 | NDC 63323085303

## 2020-07-13 MED ADMIN — GADOBENATE DIMEGLUMINE 529 MG/ML (0.1MMOL/0.2ML) IV SOLN [135881]: 18 mL | INTRAVENOUS | @ 16:00:00 | Stop: 2020-07-13 | NDC 00270516415

## 2020-07-13 MED ADMIN — PREGABALIN 75 MG PO CAP [94904]: 75 mg | ORAL | @ 03:00:00 | NDC 00904700061

## 2020-07-13 MED ADMIN — ACETAMINOPHEN 500 MG PO TAB [102]: 1000 mg | ORAL | @ 11:00:00 | NDC 00904673061

## 2020-07-13 MED ADMIN — MORPHINE 15 MG PO TAB [5178]: 15 mg | ORAL | @ 14:00:00 | NDC 00054023524

## 2020-07-13 MED ADMIN — MORPHINE 4 MG/ML IV SYRG [320654]: 4 mg | INTRAVENOUS | @ 07:00:00 | NDC 00409189103

## 2020-07-13 MED ADMIN — KETOROLAC 15 MG/ML IJ SOLN [22472]: 15 mg | INTRAVENOUS | @ 20:00:00 | Stop: 2020-07-14 | NDC 72611071901

## 2020-07-13 MED ADMIN — MORPHINE 4 MG/ML IV SYRG [320654]: 4 mg | INTRAVENOUS | @ 17:00:00 | NDC 00641612501

## 2020-07-13 NOTE — Anesthesia Post-Procedure Evaluation
Post-Anesthesia Evaluation    Name: Rhonda Prince      MRN: [id number]     DOB: Dec 23, 1985     Age: 34 y.o.     Sex: female   __________________________________________________________________________     Procedure Information     Anesthesia Start Date/Time: 07/13/20 0958    Procedure: MRI L-SPINE WO/W CONTRAST    [facility]          Post-Anesthesia Vitals  BP: 125/102 (10/26 1200)  Temp: 36.7 ?C (98.1 ?F) (10/26 1200)  Pulse: 85 (10/26 1200)  Respirations: 18 PER MINUTE (10/26 1200)  SpO2: 96 % (10/26 1200)  SpO2 Pulse: 84 (10/26 1200)   Vitals Value Taken Time   BP 125/102 07/13/20 1200   Temp 36.7 ?C (98.1 ?F) 07/13/20 1200   Pulse 85 07/13/20 1200   Respirations 18 PER MINUTE 07/13/20 1200   SpO2 96 % 07/13/20 1200   ABP     ART BP           Post Anesthesia Evaluation Note    Evaluation location: Pre/Post  Patient participation: recovered; patient participated in evaluation  Level of consciousness: alert    Pain score: 3 (back pain prior to MRI)  Pain management: adequate    Hydration: normovolemia  Temperature: 36.0?C - 38.4?C  Airway patency: adequate    Perioperative Events       Post-op nausea and vomiting: no PONV    Postoperative Status  Cardiovascular status: hemodynamically stable and tachycardic  Respiratory status: spontaneous ventilation  Follow-up needed: none        Perioperative Events  Perioperative Event: No  Emergency Case Activation: No

## 2020-07-14 ENCOUNTER — Encounter: Admit: 2020-07-14 | Discharge: 2020-07-14

## 2020-07-14 MED ADMIN — ACETAMINOPHEN 500 MG PO TAB [102]: 1000 mg | ORAL | @ 03:00:00 | NDC 00904673061

## 2020-07-14 MED ADMIN — ACETAMINOPHEN 500 MG PO TAB [102]: 1000 mg | ORAL | @ 11:00:00 | Stop: 2020-07-14 | NDC 00904673061

## 2020-07-14 MED ADMIN — MORPHINE 4 MG/ML IV SYRG [320654]: 4 mg | INTRAVENOUS | @ 05:00:00 | NDC 00641612501

## 2020-07-14 MED ADMIN — KETOROLAC 15 MG/ML IJ SOLN [22472]: 15 mg | INTRAVENOUS | @ 10:00:00 | Stop: 2020-07-14 | NDC 72611071901

## 2020-07-14 MED ADMIN — MORPHINE 15 MG PO TAB [5178]: 15 mg | ORAL | @ 11:00:00 | Stop: 2020-07-14 | NDC 00054023524

## 2020-07-14 MED ADMIN — MORPHINE 15 MG PO TAB [5178]: 15 mg | ORAL | @ 03:00:00 | NDC 00054023524

## 2020-07-14 MED ADMIN — MORPHINE 15 MG PO TAB [5178]: 15 mg | ORAL | @ 07:00:00 | Stop: 2020-07-14 | NDC 00054023524

## 2020-07-14 MED ADMIN — MORPHINE 4 MG/ML IV SYRG [320654]: 4 mg | INTRAVENOUS | @ 08:00:00 | Stop: 2020-07-14 | NDC 00641612501

## 2020-07-14 MED ADMIN — KETOROLAC 15 MG/ML IJ SOLN [22472]: 15 mg | INTRAVENOUS | @ 03:00:00 | Stop: 2020-07-14 | NDC 72611071901

## 2020-07-14 MED ADMIN — HYDROXYZINE HCL 25 MG PO TAB [3774]: 25 mg | ORAL | @ 01:00:00 | NDC 00904661761

## 2020-07-14 MED ADMIN — PREGABALIN 75 MG PO CAP [94904]: 75 mg | ORAL | @ 01:00:00 | NDC 00904700061

## 2020-09-13 ENCOUNTER — Emergency Department: Admit: 2020-09-13 | Discharge: 2020-09-13 | Disposition: A | Discharge status: Home / Self Care | Payer: MEDICAID

## 2020-09-13 ENCOUNTER — Encounter: Admit: 2020-09-13 | Discharge: 2020-09-13 | Payer: MEDICAID

## 2020-09-13 DIAGNOSIS — N83209 Unspecified ovarian cyst, unspecified side: Secondary | ICD-10-CM

## 2020-09-13 DIAGNOSIS — M5442 Lumbago with sciatica, left side: Secondary | ICD-10-CM

## 2020-09-13 LAB — COMPREHENSIVE METABOLIC PANEL
Lab: 0.6 mg/dL — ABNORMAL LOW (ref 0.4–1.00)
Lab: 11 U/L (ref 7–40)
Lab: 12 K/UL — ABNORMAL HIGH (ref 3–12)
Lab: 142 MMOL/L (ref 137–147)
Lab: 26 MMOL/L (ref 21–30)
Lab: 3.7 MMOL/L (ref 3.5–5.1)
Lab: 3.9 g/dL — ABNORMAL HIGH (ref 3.5–5.0)
Lab: 69 mg/dL — ABNORMAL LOW (ref 70–100)
Lab: 7.2 g/dL — ABNORMAL HIGH (ref 6.0–8.0)
Lab: 9 U/L (ref 7–56)
Lab: 9.2 mg/dL — ABNORMAL HIGH (ref 8.5–10.6)
Lab: 92 U/L — ABNORMAL LOW (ref 25–110)
Lab: >60 mL/min (ref >60)

## 2020-09-13 LAB — URINALYSIS DIPSTICK REFLEX TO CULTURE
Lab: 1 /HPF (ref 1.005–1.030)
Lab: NEGATIVE
Lab: NEGATIVE
Lab: NEGATIVE
Lab: NEGATIVE
Lab: NEGATIVE
Lab: NEGATIVE

## 2020-09-13 LAB — CBC AND DIFF
Lab: 0 K/UL (ref 0–0.45)
Lab: 0.1 K/UL (ref 0–0.20)
Lab: 1.3 K/UL — ABNORMAL HIGH (ref 0–0.80)
Lab: 23 — ABNORMAL HIGH (ref <20.7)
Lab: 25 pg — ABNORMAL LOW (ref 26–34)
Lab: 31 K/UL — ABNORMAL HIGH (ref 4.5–11.0)
Lab: 38 % (ref 36–45)
Lab: 8.5 FL — ABNORMAL LOW (ref 7–11)

## 2020-09-13 LAB — POC LACTATE: Lab: 1.5 MMOL/L (ref 0.5–2.0)

## 2020-09-13 LAB — POC CREATININE, RAD: Lab: 0.6 mg/dL (ref 0.4–1.00)

## 2020-09-13 LAB — URINALYSIS MICROSCOPIC REFLEX TO CULTURE

## 2020-09-13 MED ORDER — SODIUM CHLORIDE 0.9 % IV SOLP
1000 mL | Freq: Once | INTRAVENOUS | 0 refills | Status: CP
Start: 2020-09-13 — End: ?

## 2020-09-13 MED ORDER — LIDOCAINE 5 % TP PTMD
1 | Freq: Once | TOPICAL | 0 refills | Status: DC
Start: 2020-09-13 — End: 2020-09-14

## 2020-09-13 MED ORDER — METHOCARBAMOL IVPB
1000 mg | Freq: Once | INTRAVENOUS | 0 refills | Status: CP
Start: 2020-09-13 — End: ?
  Administered 2020-09-13 (×2): 1000 mg via INTRAVENOUS

## 2020-09-13 MED ORDER — SODIUM CHLORIDE 0.9 % IV SOLP
2000 mL | Freq: Once | INTRAVENOUS | 0 refills | Status: DC
Start: 2020-09-13 — End: 2020-09-13

## 2020-09-13 MED ORDER — KETOROLAC 30 MG/ML (1 ML) IJ SOLN
15 mg | Freq: Once | INTRAVENOUS | 0 refills | Status: CP
Start: 2020-09-13 — End: ?
  Administered 2020-09-13: 22:00:00 15 mg via INTRAVENOUS

## 2020-09-13 MED ADMIN — SODIUM CHLORIDE 0.9 % IV SOLP [27838]: 1000 mL | INTRAVENOUS | @ 21:00:00 | Stop: 2020-09-13 | NDC 00338004904

## 2020-09-13 NOTE — ED Provider Notes
Rhonda Prince is a 34 y.o. female.    Chief Complaint:  Chief Complaint   Patient presents with   ? Back Pain     left lower back pain that radiates to the middle, stabbing pain, pt fell       History of Present Illness:  Pt is a 34 y.o. 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 Pt reports hearing a pop from her back on 12/25 while getting up from sitting, stumbling but did not fall. She then developed severe low back pain, and had an isolated episode of urinary incontinence.  States pain is to mid and left sided low back, radiating to her LLE with numbness/tingling. She also had another episode of urinary incontinence yesterday. Pt notes she's been ambulating but needs assistance from her partner. States she has a history of loose hardware s/p exchange/re-implant, and so she is concerned. States she's been taking Oxy 10 and Gabapentin without relief. Per chart review, pt was admitted to the hospital on 10/24 for back pain. Pt did c/o LE weakness and bladder incontinence during the admission; however, her MRI did not show central spinal stenosis findings. Denies fever, coughs, CP, SOB, abdominal pain, N/V/D, dysuria, hematuria, or any other concerns. Denies smoking, alcohol or illicit drug use.         Review of Systems:  Review of Systems   Constitutional: Negative for chills and fever.   HENT: Negative for sore throat.    Eyes: Negative for visual disturbance.   Respiratory: Negative for cough and shortness of breath.    Cardiovascular: Negative for chest pain and palpitations.   Gastrointestinal: Negative for abdominal pain, diarrhea, nausea and vomiting.   Genitourinary: Negative for difficulty urinating, dysuria and hematuria.        Bladder incontinence x 2    Musculoskeletal: Positive for back pain. Negative for neck pain and neck stiffness.   Skin: Negative for rash and wound.   Neurological: Positive for numbness (LLE). Negative for dizziness, seizures, syncope, weakness and headaches.   Hematological: Does not bruise/bleed easily.   Psychiatric/Behavioral: Negative for confusion.   All other systems reviewed and are negative.      Allergies:  Pcn [penicillins]    Past Medical History:  Medical History:   Diagnosis Date   ? Ovarian cyst        Past Surgical History:  Surgical History:   Procedure Laterality Date   ? HX APPENDECTOMY     ? HX HYSTERECTOMY         Pertinent medical/surgical history reviewed  Medical History:   Diagnosis Date   ? Ovarian cyst      Surgical History:   Procedure Laterality Date   ? HX APPENDECTOMY     ? HX HYSTERECTOMY         Social History:  Social History     Tobacco Use   ? Smoking status: Current Every Day Smoker     Packs/day: 1.50     Years: 11.00     Pack years: 16.50     Types: Cigarettes   ? Smokeless tobacco: Never Used   Vaping Use   ? Vaping Use: Never used   Substance Use Topics   ? Alcohol use: Not Currently   ? Drug use: No     Social History     Substance and Sexual  Activity   Drug Use No             Family History:  No family history on file.    Vitals:  ED Vitals    Date and Time T BP P RR SPO2P SPO2 User   09/13/20 1222 37.3 ?C (99.2 ?F) 119/97 110 20 PER MINUTE 110 98 % DB   09/13/20 1200 37.3 ?C (99.2 ?F) 119/97 110 20 PER MINUTE 110 98 % CM          Physical Exam:  Physical Exam  Vitals and nursing note reviewed. Exam conducted with a chaperone present.   Constitutional:       General: She is awake. She is not in acute distress.     Appearance: Normal appearance. She is well-developed. She is obese. She is not ill-appearing, toxic-appearing or diaphoretic.   HENT:      Head: Normocephalic and atraumatic.      Right Ear: External ear normal.      Left Ear: External ear normal.      Nose: Nose normal.   Eyes:      Conjunctiva/sclera: Conjunctivae normal.   Cardiovascular:      Rate and Rhythm: Regular rhythm. Tachycardia present.      Pulses: Normal pulses.           Radial pulses are 2+ on the right side and 2+ on the left side.        Dorsalis pedis pulses are 2+ on the right side and 2+ on the left side.      Heart sounds: Normal heart sounds.   Pulmonary:      Effort: Pulmonary effort is normal. No tachypnea or respiratory distress.      Breath sounds: Normal breath sounds and air entry. No stridor. No decreased breath sounds, wheezing, rhonchi or rales.   Abdominal:      General: There is no distension.      Palpations: Abdomen is soft.      Tenderness: There is no abdominal tenderness. There is no guarding or rebound.   Genitourinary:     Rectum: Normal anal tone.      Comments: Exam done with patient care tech Matt  Musculoskeletal:         General: No deformity or signs of injury.      Cervical back: Normal range of motion and neck supple. No rigidity or tenderness. Normal range of motion.      Thoracic back: No tenderness.      Lumbar back: Tenderness (left paraspinal) and bony tenderness present. Positive left straight leg raise test. Negative right straight leg raise test.      Right lower leg: No edema.      Left lower leg: No edema.      Right foot: No foot drop.      Left foot: No foot drop.   Skin:     General: Skin is warm and dry.      Capillary Refill: Capillary refill takes less than 2 seconds.      Findings: No rash or wound.   Neurological:      General: No focal deficit present.      Mental Status: She is alert and oriented to person, place, and time.   Psychiatric:         Mood and Affect: Mood normal.         Behavior: Behavior normal.         Laboratory Results:  Labs Reviewed  CULTURE-BLOOD W/SENSITIVITY   CULTURE-BLOOD W/SENSITIVITY   URINALYSIS DIPSTICK REFLEX TO CULTURE       Result Value Ref Range Status    Color,UA YELLOW   Final    Turbidity,UA CLEAR  CLEAR-CLEAR Final    Specific Gravity-Urine 1.012  1.005 - 1.030 Final    pH,UA 6.0  5.0 - 8.0 Final    Protein,UA NEG  NEG-NEG Final Glucose,UA NEG  NEG-NEG Final    Ketones,UA NEG  NEG-NEG Final    Bilirubin,UA NEG  NEG-NEG Final    Blood,UA NEG  NEG-NEG Final    Urobilinogen,UA NORMAL  NORM-NORMAL Final    Nitrite,UA NEG  NEG-NEG Final    Leukocytes,UA NEG  NEG-NEG Final    Urine Ascorbic Acid, UA NEG  NEG-NEG Final   URINALYSIS MICROSCOPIC REFLEX TO CULTURE    WBCs,UA 0-2  0 - 2 /HPF Final    RBCs,UA NONE  0 - 3 /HPF Final    Comment,UA     Final    Value: Criteria for reflex to culture are WBC>10, Positive Nitrite, and/or >=+1   leukocytes. If quantity is not sufficient, an addendum will follow.      MucousUA TRACE   Final   POC LACTATE    LACTIC ACID POC 1.5  0.5 - 2.0 MMOL/L Final   POC CREATININE, RAD    Creatinine, POC 0.6  0.4 - 1.00 MG/DL Final   UA REFLEX LABEL   CBC AND DIFF   COMPREHENSIVE METABOLIC PANEL   POC LACTATE   POC LACTATE   POC CREATININE          Radiology Interpretation:    L SPINE AP & LATERAL    (Results Pending)     EKG:  None    ED Course:    Pt seen and evaluated in the ED for acute on chronic low back pain.  Physical exam significant for tachycardia, midline and left paraspinal lumbar tenderness with positive left SLR. Normal anal tone. No foot drops. Strength 5/5 BLE. Neurovascularly intact.   Labs and L spine Xrays ordered.  NS bolus, Toradol, Robaxin and Lidoderm patch given.   Pt crying in room, stating none of the medications worked and refused to get the Xrays done without stronger pain medications. Pt and partner stated that they were seen at [facility] 2 days ago and had MRI performed, and that pt was recommended admission but left because there was no room available. Discussed with pt plan of care pending Xrays and lab results. Pt decided to leave AMA. Discussed with pt risks of leaving including but not limited to sepsis and death. Pt capable of making informed decision and could leave at her own will.   Pt ambulated out of the ED in stable condition.      MDM  Reviewed: nursing note, vitals and previous chart  Interpretation: labs and x-ray        Facility Administered Meds:  Medications   lidocaine (LIDODERM) 5 % topical patch 1 patch (1 patch Topical Med Not Given 09/13/20 1543)   ketorolac (TORADOL) injection 15 mg (15 mg Intravenous Given 09/13/20 1542)   methocarbamoL (ROBAXIN) 1,000 mg in dextrose 5% (D5W) 100 mL IVPB (0 mg Intravenous Infusion Stopped 09/13/20 1555)   sodium chloride 0.9 %   infusion (0 mL Intravenous Infusion Stopped 09/13/20 1554)         Clinical Impression:  Clinical Impression   Midline low back pain with left-sided sciatica, unspecified chronicity  Disposition/Follow up  ED Disposition     ED Disposition    AMA        No follow-up provider specified.    Medications:  Discharge Medication List as of 09/13/2020  4:12 PM          Procedure Notes:  Procedures        Attestation / Supervision:    Jeralene Peters, APRN-NP, personally performed the services described in this documentation and it is both accurate and complete.    Otis Peak, APRN-NP

## 2020-09-13 NOTE — ED Notes
Patient elected to leave AMA. PIV removed, and patient ambulated out of department without difficulty.

## 2020-09-13 NOTE — ED Notes
Rhonda Prince is a 34 yo female presenting to [facility] with CC of back pain. Pt reports she heard a "pop" from her back on Saturday and has had 10/10 back pain since then. Pt reports pain is from mid-lower back and radiates to left leg. Pt reports hx of 5 back surgeries and two sciatic fusions completed a few months ago. Pt reports calling the pain clinic, but reports the physician was out of office for the week. Pt states she takes oxycodone 10 mg every 6 hours with no pain relief. A&Ox4, breathing even unlabored, NAD noted. Pt resting on locked recliner.

## 2021-06-07 ENCOUNTER — Emergency Department: Admit: 2021-06-07 | Discharge: 2021-06-07 | Payer: MEDICAID

## 2021-06-07 ENCOUNTER — Encounter: Admit: 2021-06-07 | Discharge: 2021-06-07 | Payer: MEDICAID

## 2021-06-07 ENCOUNTER — Emergency Department: Admit: 2021-06-07 | Discharge: 2021-06-08 | Payer: MEDICAID

## 2021-06-07 DIAGNOSIS — M545 Acute midline low back pain without sciatica: Secondary | ICD-10-CM

## 2021-06-07 LAB — COMPREHENSIVE METABOLIC PANEL
ALBUMIN: 4.6 g/dL (ref 3.5–5.0)
ALK PHOSPHATASE: 142 U/L — ABNORMAL HIGH (ref 25–110)
ALT: 13 U/L (ref 7–56)
ANION GAP: 13 K/UL — ABNORMAL HIGH (ref 3–12)
AST: 13 U/L (ref 7–40)
BLD UREA NITROGEN: 3 mg/dL — ABNORMAL LOW (ref 7–25)
CALCIUM: 9.4 mg/dL (ref 8.5–10.6)
CHLORIDE: 104 MMOL/L (ref 98–110)
CO2: 24 MMOL/L — ABNORMAL HIGH (ref 21–30)
CREATININE: 0.6 mg/dL — ABNORMAL HIGH (ref 0.4–1.00)
EGFR: >60 mL/min (ref >60)
GLUCOSE,PANEL: 83 mg/dL (ref 70–100)
POTASSIUM: 3.7 MMOL/L (ref 3.5–5.1)
SODIUM: 141 MMOL/L (ref 137–147)
TOTAL BILIRUBIN: 0.4 mg/dL — ABNORMAL LOW (ref 0.3–1.2)
TOTAL PROTEIN: 7.3 g/dL (ref 6.0–8.0)

## 2021-06-07 LAB — URINALYSIS DIPSTICK REFLEX TO CULTURE
GLUCOSE,UA: NEGATIVE
LEUKOCYTES: NEGATIVE
NITRITE: NEGATIVE
URINE ASCORBIC ACID, UA: NEGATIVE
URINE BILE: NEGATIVE
URINE BLOOD: NEGATIVE
URINE KETONE: NEGATIVE

## 2021-06-07 LAB — URINALYSIS MICROSCOPIC REFLEX TO CULTURE

## 2021-06-07 LAB — PTT (APTT): PTT: 36 s (ref 24.0–36.5)

## 2021-06-07 LAB — CBC AND DIFF
ABSOLUTE BASO COUNT: 0.1 K/UL (ref 0–0.20)
MDW (MONOCYTE DISTRIBUTION WIDTH): 19 (ref <20.7)
WBC COUNT: 14 K/UL — ABNORMAL HIGH (ref 4.5–11.0)

## 2021-06-07 LAB — PROTIME INR (PT): PROTIME: 10 s — ABNORMAL HIGH (ref 9.5–14.2)

## 2021-06-07 MED ORDER — KETOROLAC 15 MG/ML IJ SOLN
15 mg | Freq: Once | INTRAVENOUS | 0 refills | Status: CP
Start: 2021-06-07 — End: ?
  Administered 2021-06-08: 03:00:00 15 mg via INTRAVENOUS

## 2021-06-07 MED ORDER — FENTANYL CITRATE (PF) 50 MCG/ML IJ SOLN
75 ug | INTRAVENOUS | 0 refills | Status: CP | PRN
Start: 2021-06-07 — End: ?
  Administered 2021-06-07 (×2): 75 ug via INTRAVENOUS

## 2021-06-07 MED ORDER — OXYCODONE 10 MG PO TAB
10 mg | Freq: Once | ORAL | 0 refills | Status: CP
Start: 2021-06-07 — End: ?
  Administered 2021-06-08: 04:00:00 10 mg via ORAL

## 2021-06-07 MED ORDER — PREDNISONE 50 MG PO TAB
50 mg | ORAL_TABLET | Freq: Every day | ORAL | 0 refills | Status: AC
Start: 2021-06-07 — End: ?

## 2021-06-07 MED ORDER — IMS MIXTURE TEMPLATE
50 mg | Freq: Once | ORAL | 0 refills | Status: CP
Start: 2021-06-07 — End: ?
  Administered 2021-06-08 (×2): 50 mg via ORAL

## 2021-06-07 MED ORDER — HYDROMORPHONE (PF) 2 MG/ML IJ SYRG
.5 mg | Freq: Once | INTRAVENOUS | 0 refills | Status: CP
Start: 2021-06-07 — End: ?
  Administered 2021-06-08: 01:00:00 0.5 mg via INTRAVENOUS

## 2021-06-07 MED ORDER — HYDROMORPHONE (PF) 2 MG/ML IJ SYRG
1 mg | Freq: Once | INTRAVENOUS | 0 refills | Status: CP
Start: 2021-06-07 — End: ?
  Administered 2021-06-08: 03:00:00 1 mg via INTRAVENOUS

## 2021-06-07 MED ORDER — FENTANYL CITRATE (PF) 50 MCG/ML IJ SOLN
75 ug | Freq: Once | INTRAVENOUS | 0 refills | Status: CP
Start: 2021-06-07 — End: ?
  Administered 2021-06-08: 02:00:00 75 ug via INTRAVENOUS

## 2021-06-07 MED ORDER — FENTANYL CITRATE (PF) 50 MCG/ML IJ SOLN
50 ug | Freq: Once | INTRAVENOUS | 0 refills | Status: DC
Start: 2021-06-07 — End: 2021-06-08

## 2021-06-07 MED ORDER — METHOCARBAMOL 500 MG PO TAB
500 mg | Freq: Once | ORAL | 0 refills | Status: CP
Start: 2021-06-07 — End: ?
  Administered 2021-06-08: 03:00:00 500 mg via ORAL

## 2021-06-07 MED ORDER — LORAZEPAM 1 MG PO TAB
.5 mg | Freq: Once | ORAL | 0 refills | Status: CP
Start: 2021-06-07 — End: ?
  Administered 2021-06-08: 01:00:00 0.5 mg via ORAL

## 2021-06-07 NOTE — ED Notes
Pt presents to ED with acute on chronic back pain. Pt reports that over the past couple of days she has had incontinence of the bladder as well as increased weakness of bilateral lower extremities.     Pt is able to ambulate with one assist. Denies numbness in either leg. Able to move all four extremities spontaneously. Respirations are even and unlabored. Skin is PWD. Pt is able to speak in full sentences with ease and recline back in stretcher.     Pt able to ambulate to restroom to. Post-void residual bladder scan: 70cc    PMHx:

## 2021-06-08 NOTE — Unmapped
You were seen at the [facility] Emergency Department.    Since you received medications during your ER stay that can make you sleepy, you should not drive/operate machinery or perform any high risk activity for 24 hours.  Please continue to follow-up with your pain for further management of your lower back pain.  We did not find any significant emergent narrowing or compression of the spinal cord, so we feel safe letting you go home at this time.  Please return to the emergency department for worsening fevers, numbness or tingling in the groin, weakness, or inability to urinate.    Your workup showed no imminent life threatening emergencies. Thus, we feel safe letting you go home at this time. Please follow-up with the provider indicated in your discharge paperwork. If you are following up with a doctor not in the [facility] network, please have your doctor's office contact medical records so your doctor can obtain full records from your visit today and go over them.    Please report to nearest emergency department for any sudden decline in overall health, new symptoms, or any other concern.

## 2021-06-08 NOTE — ED Notes
Pt. Appropriate for DC at this time. Pt given AVS and verbalized understanding of instructions with no further questions. Pt educated on follow up care and agreeable to plan of care. Pt ambulates out of department with even and steady gait and all belongings.

## 2021-07-28 ENCOUNTER — Emergency Department: Admit: 2021-07-28 | Discharge: 2021-07-28 | Payer: MEDICAID

## 2021-07-28 ENCOUNTER — Encounter: Admit: 2021-07-28 | Discharge: 2021-07-28 | Payer: MEDICAID

## 2021-07-28 ENCOUNTER — Observation Stay: Admit: 2021-07-28 | Discharge: 2021-07-28 | Payer: MEDICAID

## 2021-07-28 DIAGNOSIS — N83209 Unspecified ovarian cyst, unspecified side: Secondary | ICD-10-CM

## 2021-07-28 DIAGNOSIS — R52 Pain, unspecified: Secondary | ICD-10-CM

## 2021-07-28 LAB — BASIC METABOLIC PANEL
BLD UREA NITROGEN: 8 mg/dL (ref 7–25)
CALCIUM: 9.2 mg/dL (ref 8.5–10.6)
CREATININE: 0.6 mg/dL (ref 0.4–1.00)
EGFR: >60 mL/min (ref >60)
GLUCOSE,PANEL: 149 mg/dL — ABNORMAL HIGH (ref 70–100)
SODIUM: 135 MMOL/L — ABNORMAL LOW (ref 137–147)

## 2021-07-28 LAB — URINALYSIS DIPSTICK REFLEX TO CULTURE
GLUCOSE,UA: NEGATIVE mg/dL — ABNORMAL HIGH (ref 8.5–10.6)
LEUKOCYTES: NEGATIVE MMOL/L (ref 21–30)
NITRITE: NEGATIVE U/L (ref 7–40)
PROTEIN,UA: NEGATIVE mg/dL (ref 0.4–1.00)
URINE ASCORBIC ACID, UA: NEGATIVE U/L (ref 7–56)
URINE BILE: NEGATIVE mg/dL (ref 0.3–1.2)
URINE BLOOD: NEGATIVE g/dL (ref 3.5–5.0)
URINE KETONE: NEGATIVE g/dL — ABNORMAL HIGH (ref 6.0–8.0)

## 2021-07-28 LAB — CBC AND DIFF
ABSOLUTE BASO COUNT: 0.1 K/UL (ref 0–0.20)
ABSOLUTE EOS COUNT: 0.2 K/UL (ref 0–0.45)
ABSOLUTE MONO COUNT: 0.6 K/UL (ref 0–0.80)
MDW (MONOCYTE DISTRIBUTION WIDTH): 21 — ABNORMAL HIGH (ref <20.7)
WBC COUNT: 17 K/UL — ABNORMAL HIGH (ref 4.5–11.0)

## 2021-07-28 LAB — COMPREHENSIVE METABOLIC PANEL
ANION GAP: 10 K/UL — ABNORMAL HIGH (ref 3–12)
EGFR: >60 mL/min (ref >60)
SODIUM: 137 MMOL/L (ref 137–147)

## 2021-07-28 LAB — PHOSPHORUS: PHOSPHORUS: 4.8 mg/dL — ABNORMAL HIGH (ref 2.0–4.5)

## 2021-07-28 LAB — TSH WITH FREE T4 REFLEX: TSH: 1.2 uU/mL (ref 0.35–5.00)

## 2021-07-28 LAB — PROTIME INR (PT)
INR: 0.9 — ABNORMAL HIGH (ref 0.8–1.2)
PROTIME: 10 s (ref 9.5–14.2)

## 2021-07-28 LAB — PTT (APTT): PTT: 37 s — ABNORMAL HIGH (ref 24.0–36.5)

## 2021-07-28 LAB — URINALYSIS MICROSCOPIC REFLEX TO CULTURE

## 2021-07-28 LAB — MAGNESIUM
MAGNESIUM: 2 mg/dL (ref 1.6–2.6)
MAGNESIUM: 2.1 mg/dL (ref 1.6–2.6)

## 2021-07-28 LAB — POC TROPONIN: TROPONIN I, POC: 0 ng/mL (ref 0.00–0.05)

## 2021-07-28 MED ORDER — SODIUM CHLORIDE 0.9 % IV SOLP
1000 mL | Freq: Once | INTRAVENOUS | 0 refills | Status: CP
Start: 2021-07-28 — End: ?
  Administered 2021-07-29: 05:00:00 1000 mL via INTRAVENOUS

## 2021-07-28 MED ORDER — OXYCODONE 5 MG PO TAB
5-10 mg | ORAL | 0 refills | Status: AC | PRN
Start: 2021-07-28 — End: ?
  Administered 2021-07-28 – 2021-07-29 (×3): 10 mg via ORAL

## 2021-07-28 MED ORDER — ONDANSETRON 4 MG PO TBDI
4 mg | ORAL | 0 refills | Status: AC | PRN
Start: 2021-07-28 — End: ?

## 2021-07-28 MED ORDER — LIDOCAINE 5 % TP PTMD
1 | Freq: Every day | TOPICAL | 0 refills | Status: AC
Start: 2021-07-28 — End: ?

## 2021-07-28 MED ORDER — MORPHINE 4 MG/ML IV SYRG
4 mg | Freq: Once | INTRAVENOUS | 0 refills | Status: CP
Start: 2021-07-28 — End: ?
  Administered 2021-07-28: 22:00:00 4 mg via INTRAVENOUS

## 2021-07-28 MED ORDER — IOHEXOL 350 MG IODINE/ML IV SOLN
100 mL | Freq: Once | INTRAVENOUS | 0 refills | Status: CP
Start: 2021-07-28 — End: ?
  Administered 2021-07-28: 21:00:00 100 mL via INTRAVENOUS

## 2021-07-28 MED ORDER — POLYETHYLENE GLYCOL 3350 17 GRAM PO PWPK
1 | Freq: Every day | ORAL | 0 refills | Status: AC | PRN
Start: 2021-07-28 — End: ?

## 2021-07-28 MED ORDER — ENOXAPARIN 40 MG/0.4 ML SC SYRG
40 mg | Freq: Every day | SUBCUTANEOUS | 0 refills | Status: AC
Start: 2021-07-28 — End: ?
  Administered 2021-07-29: 02:00:00 40 mg via SUBCUTANEOUS

## 2021-07-28 MED ORDER — HYDROMORPHONE (PF) 2 MG/ML IJ SYRG
1 mg | INTRAVENOUS | 0 refills | Status: AC | PRN
Start: 2021-07-28 — End: ?
  Administered 2021-07-28 – 2021-07-29 (×5): 1 mg via INTRAVENOUS

## 2021-07-28 MED ORDER — SENNOSIDES-DOCUSATE SODIUM 8.6-50 MG PO TAB
1 | Freq: Every day | ORAL | 0 refills | Status: AC | PRN
Start: 2021-07-28 — End: ?

## 2021-07-28 MED ORDER — MORPHINE 4 MG/ML IV SYRG
4 mg | Freq: Once | INTRAVENOUS | 0 refills | Status: CP
Start: 2021-07-28 — End: ?
  Administered 2021-07-28: 20:00:00 4 mg via INTRAVENOUS

## 2021-07-28 MED ORDER — HYDROMORPHONE (PF) 2 MG/ML IJ SYRG
.5-1 mg | INTRAVENOUS | 0 refills | Status: DC | PRN
Start: 2021-07-28 — End: 2021-07-28

## 2021-07-28 MED ORDER — ONDANSETRON HCL (PF) 4 MG/2 ML IJ SOLN
4 mg | INTRAVENOUS | 0 refills | Status: AC | PRN
Start: 2021-07-28 — End: ?

## 2021-07-28 MED ORDER — LIDOCAINE 5 % TP PTMD
1 | Freq: Once | TOPICAL | 0 refills | Status: AC
Start: 2021-07-28 — End: ?
  Administered 2021-07-28: 19:00:00 1 via TOPICAL

## 2021-07-28 MED ORDER — METHOCARBAMOL 750 MG PO TAB
750 mg | Freq: Four times a day (QID) | ORAL | 0 refills | Status: AC
Start: 2021-07-28 — End: ?
  Administered 2021-07-28 – 2021-07-29 (×3): 750 mg via ORAL

## 2021-07-28 MED ORDER — MORPHINE 4 MG/ML IV SYRG
4 mg | Freq: Once | INTRAVENOUS | 0 refills | Status: CP
Start: 2021-07-28 — End: ?
  Administered 2021-07-28: 19:00:00 4 mg via INTRAVENOUS

## 2021-07-28 MED ORDER — MELATONIN 5 MG PO TAB
5 mg | Freq: Every evening | ORAL | 0 refills | Status: AC | PRN
Start: 2021-07-28 — End: ?

## 2021-07-28 MED ORDER — LACTATED RINGERS IV SOLP
500 mL | INTRAVENOUS | 0 refills | Status: CP
Start: 2021-07-28 — End: ?
  Administered 2021-07-28: 19:00:00 500 mL via INTRAVENOUS

## 2021-07-28 MED ORDER — DIAZEPAM 2 MG PO TAB
4 mg | Freq: Once | ORAL | 0 refills | Status: CP
Start: 2021-07-28 — End: ?
  Administered 2021-07-29: 04:00:00 4 mg via ORAL

## 2021-07-28 MED ORDER — SODIUM CHLORIDE 0.9 % IJ SOLN
50 mL | Freq: Once | INTRAVENOUS | 0 refills | Status: CP
Start: 2021-07-28 — End: ?
  Administered 2021-07-28: 21:00:00 50 mL via INTRAVENOUS

## 2021-07-28 MED ORDER — ACETAMINOPHEN 500 MG PO TAB
1000 mg | ORAL | 0 refills | Status: AC
Start: 2021-07-28 — End: ?
  Administered 2021-07-28 – 2021-07-29 (×3): 1000 mg via ORAL

## 2021-07-28 NOTE — ED Notes
Pt ambulated to restroom w/ standby by assist.

## 2021-07-28 NOTE — ED Notes
35 yr old female presented to the ED with CC of lower back pain. Pt states that the pain started x4 days ago. Denies trauma. Pt endorses that she took at home pain medications with no relief. Pt states that she has lost control of her bladder control and lost feeling on the L inner groin area. Hx of lower back pain. A&Ox4, skin w/d/i, eupneic.

## 2021-07-28 NOTE — ED Provider Notes
Rhonda Prince is a 35 y.o. female.    Chief Complaint:  Chief Complaint   Patient presents with   ? Back Pain     Left side back pain x 3-4 days w/weakness on left leg, loss of bladder control . Denies fall/trauma       History of Present Illness:  Patient is a 35 year old female with a significant past medical history of a L5-S1 spinal fusion presenting to the emergency department today due to 5 days of worsening left-sided back pain.  Patient states the back pain is sharp, constant, and radiates down to her left knee.  She has a history of chronic back pain and takes Flexeril, oxycodone, and ibuprofen.  This regimen has not provided relief over the last few days.  Patient states she fell onto her left hip 2 days ago when she tried to stand up.  She denies any other trauma at this time. She endorses chills, left hip pain, worsening of urinary incontinence, and numbness of her left leg since this happened.  She also endorses weakness but states she has been walking to get around.  She denies headache, lightheadedness, dizziness, nausea, vomiting, chest pain, shortness of breath, bowel incontinence, and urinary retention.          Review of Systems:  Review of Systems   Constitutional: Positive for chills. Negative for fever.   HENT: Negative for congestion.    Eyes: Negative for visual disturbance.   Respiratory: Negative for shortness of breath.    Cardiovascular: Positive for palpitations. Negative for chest pain and leg swelling.   Gastrointestinal: Negative for abdominal pain, nausea and vomiting.   Genitourinary: Negative for dysuria.   Musculoskeletal: Positive for back pain. Negative for gait problem and neck pain.   Skin: Negative for wound.   Neurological: Positive for numbness. Negative for syncope, light-headedness and headaches.       Allergies:  Pcn [penicillins]    Past Medical History:  Medical History:   Diagnosis Date   ? Ovarian cyst        Past Surgical History:  Surgical History: Procedure Laterality Date   ? HX APPENDECTOMY     ? HX HYSTERECTOMY         Pertinent medical and surgical history reviewed.    Social History:  Social History     Tobacco Use   ? Smoking status: Current Every Day Smoker     Packs/day: 1.50     Years: 11.00     Pack years: 16.50     Types: Cigarettes   ? Smokeless tobacco: Never Used   Vaping Use   ? Vaping Use: Never used   Substance Use Topics   ? Alcohol use: Not Currently   ? Drug use: No     Social History     Substance and Sexual Activity   Drug Use No             Family History:  No family history on file.    Vitals:  ED Vitals    Date and Time T BP P RR SPO2P SPO2 User   07/28/21 1111 36.7 ?C (98 ?F) 127/95 143 20 PER MINUTE -- 100 % JC          Physical Exam:  Physical Exam  Vitals and nursing note reviewed.   Constitutional:       Appearance: Normal appearance. She is normal weight.   HENT:      Head: Normocephalic and atraumatic.  Eyes:      Extraocular Movements: Extraocular movements intact.      Conjunctiva/sclera: Conjunctivae normal.   Cardiovascular:      Rate and Rhythm: Regular rhythm. Tachycardia present.      Heart sounds: Normal heart sounds.   Pulmonary:      Effort: Pulmonary effort is normal.      Breath sounds: Normal breath sounds.   Abdominal:      General: Bowel sounds are normal. There is no distension.      Palpations: Abdomen is soft.      Tenderness: There is no abdominal tenderness.   Musculoskeletal:      Cervical back: Neck supple. No tenderness.      Thoracic back: No tenderness.      Lumbar back: No tenderness.      Right hip: No tenderness.      Left hip: Tenderness present.      Right lower leg: No edema.      Left lower leg: No edema.      Comments: Tenderness to palpation of left lumbar musculature   Skin:     General: Skin is warm and dry.   Neurological:      Mental Status: Mental status is at baseline.      Motor: No weakness.      Gait: Gait is intact.      Comments: Diminished sensation of left medial leg Psychiatric:         Mood and Affect: Mood normal.         Behavior: Behavior normal.         Laboratory Results:  Labs Reviewed   CBC AND DIFF   COMPREHENSIVE METABOLIC PANEL   URINALYSIS DIPSTICK REFLEX TO CULTURE   URINALYSIS MICROSCOPIC REFLEX TO CULTURE   UA GREY TOP TUBE   CLEAR TOP EXTRA URINE TUBE   POC URINE PREGNANCY     Urine Pregnancy  Urine Pregnancy: Negative  QC: Acceptable  Urine Pregnancy Lot #: [id number]    Radiology Interpretation:    HIP 2-3 VIEWS W PELVIS LT   Final Result         1.  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      2.  Instrumentation overlying the lumbosacral junction is again seen.      3.  SI joints and symphysis pubis are maintained.          Finalized by Shanna Cisco, M.D. on 07/28/2021 1:42 PM. Dictated by Shanna Cisco, M.D. on 07/28/2021 1:36 PM.               EKG:  ECG Results          ECG 12-LEAD (Final result)  Component (Lab Inquiry)     Collection Time Result Time VT RATE P-R Interval QRS DURATION Q-T Interval QTC Calc Bazett    07/28/21 13:03:16 07/28/21 13:13:43 109 138 78 344 463         Collection Time Result Time P Axis R Axis T Axis    07/28/21 13:03:16 07/28/21 13:13:43 7 15 40               Final result                 Impression:    Sinus tachycardia  T wave abnormality, consider anterior ischemia  Abnormal ECG  When compared with ECG of 28-Jul-2021 11:16,  Questionable change in QRS duration  Borderline criteria for Anterior infarct  are no longer present  st depression [id number], sinus??tachy, normal axis, no stemi  Confirmed by Lillia Corporal ([id number]) on 07/28/2021 1:13:40 PM                             ECG 12-LEAD (Final result)  Component (Lab Inquiry)     Collection Time Result Time VT RATE P-R Interval QRS DURATION Q-T Interval QTC Calc Bazett    07/28/21 11:16:10 07/28/21 12:52:18 132 144 116 304 450         Collection Time Result Time P Axis R Axis T Axis    07/28/21 11:16:10 07/28/21 12:52:18 64 40 115 Final result                 Impression:    Sinus tachycardia  Possible Anterior infarct , age undetermined  ST & T wave abnormality, consider lateral ischemia  No previous ECGs available in [facility]  sinus tachy normal axis qtc >500, no stemi,   Confirmed by Lillia Corporal ([id number]) on 07/28/2021 12:52:15 PM                              ED Course:  Kemaya Bedward is a 35 y.o. female with history as above presenting for complaint of back pain associated with worsening urinary incontinence, numbness, and a recent fall.  Patient states she fell 2 days ago onto her left hip.  IV access was established and patient was placed on telemetry monitoring. Vital signs and physical exam as documented above and notable for tachycardia, tenderness to palpation of the left hip, tenderness to palpation over left lumbar musculature, and diminished sensation of the left medial leg.    Laboratory evaluation and imaging conducted as above with finding of white blood cell count of 17, elevated phosphorus and urinalysis did not show signs of infection. EKG initially resembled a wide complex tachycardia. There were multiples runs of wide complex tachycardia noted on telemetry as well. Troponin was within normal limits.     Patient given morphine for pain management and 500cc of lactated ringers. Cardiology was subsequently consulted and recommended echo and stress test evaluation if patient was going to be admitted. On reassessment, patient stated she would like to leave due to frustration and pain. Patient counseled on awaiting imaging results and plan for evaluation of tachycardia. Patient agreeable to plan at this time. Patient was further counseled on plan for admission for pain control and cardiac evaluation. Patient expresses understanding of plan and is agreeable at this time. Patient care transferred to Dr. Lavell Luster, anticipate admission.        ED Scoring:                                Coding    Facility Administered Meds:  Medications morphine injection 4 mg (has no administration in time range)       Active Problem List/Diagnosis Related to Current Presentation:  Back Pain   Tachycardia   Fall      Clinical Impression:  Clinical Impression   None       Disposition/Follow up  ED Disposition     ED Disposition   Admit           No follow-up provider specified.    Medications:  New Prescriptions    No medications on file  Procedure Notes:  Procedures      Attestation / Supervision:    Howell Rucks, MD   Emergency Medicine, PGY-1  [facility]  07/28/2021

## 2021-07-28 NOTE — H&P (View-Only)
Admission History and Physical Assessment         Name:  Rhonda Prince                                             MRN:  [id number]   Admission Date:  07/28/2021  Principal Problem:    Intractable pain  Active Problems:    PSVT (paroxysmal supraventricular tachycardia) (HCC)                       Assessment and Plan       Patient is a relatively healthy 35 year old Caucasian female with with chronic back pain that presented to the ED with left hip pain that is intractable being admitted for pain control.    Chronic back pain  Acute left hip pain, intractable  -Has had multiple falls however states the left hip pain started while she was sitting there around 4 to 5 days ago and has been progressive to the point of not being able to ambulate however of note patient noted to go to the bathroom in the ED on her own  -Patient received 12 mg of IV morphine in the ED and continues to be in significant pain  -X-ray left hip shows degenerative changes of bilateral hips without fracture or acute osseous abnormality.  SI joints and symphysis pubis are maintained.  -CT of the abdomen pelvis with contrast shows no acute process in the abdomen or pelvis.  Similar postoperative changes of the lumbosacral interspace.  Diffuse hepatic steatosis and moderate colonic stool.  -On my exam the patient has more musculoskeletal pain and not joint pain, was able to flex at the hip while moving the blanket without any pain however same degree of range of motion actively seem to cause significant pain to the patient which is discordant  -As mentioned in HPI patient does follow with pain clinic  -We will try schedule acetaminophen, Robaxin, lidocaine patch, IV Dilaudid for pain control  -Hope to titrate IV pain medications within the next 24 hours and discharge so she can follow with her pain clinic    Tachycardia  Rate dependent bundle branch block  T wave inversion  -Patient significantly tachycardic in the ED, initially thought to have wide-complex tachycardia however seems to be bundle branch block thus tachycardia with aberrancy  -Seen by cardiology team in the ED  -We will obtain echocardiogram since patient has decided to stay and additionally will order stress test for tomorrow  -Currently no chest pain or shortness of breath  -TSH within normal limits, troponin not elevated            IVF: none     Nutrition: Regular diet     VTE prophylaxis: Lovenox       Code status: Full Code       Disposition: Admission to medicine     Murvin Natal, DO  Pager [phone]      ___________________________________________________________________________  Primary Care Physician: No Pcp, Na      Chief Complaint: Left hip pain    History of Present Illness:     Patient is a relatively healthy 35 year old Caucasian female with with chronic back pain that presented to the ED with left hip pain that is intractable being admitted for pain control.    Patient states that about 4 days ago started developing  left hip pain on the lateral aspect of her hip that is 10 out of 10, sharp without any alleviating factor.  States that she was sitting there when this started.  However subsequently has had a fall in the past few days on that left hip which has made her worse.  Patient denies any other trauma.  Does have chronic back pain and previously admitted for this however currently back pain is at baseline.  States she has been well otherwise.  Does follow with pain clinic in [place].          Past Medical History:  Medical History:   Diagnosis Date   ? Ovarian cyst          Past Surgical History:  Surgical History:   Procedure Laterality Date   ? HX APPENDECTOMY     ? HX HYSTERECTOMY           Home Medications:  No current facility-administered medications on file prior to encounter.     Current Outpatient Medications on File Prior to Encounter   Medication Sig Dispense Refill   ? acetaminophen (TYLENOL EXTRA STRENGTH) 500 mg tablet Take two tablets by mouth every 8 hours. Max of 4,000 mg of acetaminophen in 24 hours.  0   ? diazePAM (VALIUM) 5 mg tablet Take 5 mg by mouth twice daily as needed for Anxiety.     ? methocarbamoL (ROBAXIN) 750 mg tablet Take one tablet by mouth four times daily. 15 tablet 0   ? nicotine (NICODERM CQ STEP 1) 21 mg/day patch Apply one patch to top of skin as directed daily. Rotate patch location.  Indications: stop smoking 28 patch 0   ? nicotine (NICODERM CQ STEP 2) 14 mg/day patch Apply one patch to top of skin as directed daily. Rotate patch location.  Indications: stop smoking 28 patch 0   ? nicotine polacrilex (NICORETTE) 2 mg gum Take one each by mouth every 1 hour as needed. Chew to soften and park in mouth between lip and gum. May use 1 piece per hour, not to exceed 24 per day, for 12 weeks. May be used for longer, if needed. 220 each 0   ? omeprazole DR(+) (PRILOSEC) 20 mg capsule Take 20 mg by mouth daily before breakfast.     ? oxycodone HCl (OXYCODONE PO) Take 7.5 mg by mouth every 6 hours as needed.     ? polyethylene glycol 3350 (MIRALAX) 17 g packet Take 17 g by mouth twice daily as needed.     ? predniSONE (DELTASONE) 50 mg tablet Take one tablet by mouth daily. 5 tablet 0         Allergies:  Pcn [penicillins]    Social History:  Social History     Socioeconomic History   ? Marital status: Single   ? Number of children: 2   Tobacco Use   ? Smoking status: Current Every Day Smoker     Packs/day: 1.50     Years: 11.00     Pack years: 16.50     Types: Cigarettes   ? Smokeless tobacco: Never Used   Vaping Use   ? Vaping Use: Never used   Substance and Sexual Activity   ? Alcohol use: Not Currently   ? Drug use: No         Family History:  History reviewed. No pertinent family history.      Immunizations:   Immunization History   Administered Date(s) Administered   ? Flu Vaccine =>  6 Months Quadrivalent PF 07/12/2020   ? Pneumococcal Vaccine (23-Val Adult) 01/30/2012             Review of Systems:    All systems reviewed and negative with exception of: Significant left hip pain, chronic back pain, urinary incontinence, constipation        PHYSICAL EXAM:    Vital Signs: Last Filed In 24 Hours   BP: 123/99 (11/10 1500)  Temp: 36.7 ?C (98 ?F) (11/10 1111)  Pulse: 112 (11/10 1500)  Respirations: 14 PER MINUTE (11/10 1500)  SpO2: 98 % (11/10 1500)  O2 Device: None (Room air) (11/10 1111)  SpO2 Pulse: 112 (11/10 1500)       Gen: Appears in pain and uncomfortable  HEENT: AT/NC. No scleral icterus, EOM grossly intact. Ears/nose grossly normal. Throat is clear. Moist mucous membranes    Cardiovascular: Regular rhythm, rate in the low 100s, normal s1s2, no m/r/g  Respiratory: Normal respiratory effort, CTAB  Gastrointestinal: Non-distended soft abdomen, bowel sounds present, no peritoneal signs    Musculoskeletal: Spontaneously moves all extremities.  Significant pain on palpation of lateral aspect of left proximal thigh.  Pain with flexion and extension of left hip.  Extremities: No edema  Skin: Dry, no noted rashes   Neurological: A&O x3. Grossly non-focal  Psychiatry: Mood and affect congruent       Labs and Data:  Pertinent labs reviewed  Glucose: 73 (07/28/21 1245)  Urine Pregnancy: Negative (07/28/21 1237)    CT ABD/PELV W CONTRAST    Result Date: 07/28/2021  1. No acute process in the abdomen or pelvis. 2. Similar postoperative changes at the lumbosacral interspace. Consider MRI of the lumbar spine as clinically indicated. 3. Diffuse hepatic steatosis. 4. Moderate colonic stool.  Finalized by Kathyrn Lass, M.D. on 07/28/2021 3:16 PM. Dictated by Kathyrn Lass, M.D. on 07/28/2021 3:10 PM.     HIP 2-3 VIEWS W PELVIS LT    Result Date: 07/28/2021  1.  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 2.  Instrumentation overlying the lumbosacral junction is again seen. 3.  SI joints and symphysis pubis are maintained.  Finalized by Shanna Cisco, M.D. on 07/28/2021 1:42 PM. Dictated by Shanna Cisco, M.D. on 07/28/2021 1:36 PM.       Pertinent radiology reviewed., EKG Reviewed

## 2021-07-28 NOTE — Consults
STAFF CARDIOLOGY CONSULTATION NOTE    Admission Date: 07/28/2021  Code Status: Prior    Assessment  1. 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  2. T wave inversions on ECG at a lower heart rate.  3. Back pain.  4. Hip pain.  5. Back and hip surgery in the past.  6. Chronic leukocytosis.  7. Chronic thrombocytosis.  8. Back pain use with cessation a few months ago.  9. Anxiety.    Plan  I am not sure what her overall disposition is at this time.  If she is being admitted to the hospital, I think we could get an echocardiogram to reassess her heart function.  I think a repeat myocardial perfusion study at some point given the T wave inversions and rate dependent bundle branch block would be reasonable.  If she is in the hospital tomorrow I think it would be reasonable to do it then.    Overall her blood pressure is a little high at times, but she is had pain issues.  Her heart rate has improved since she has been in the ED.    I do not think we need to make any medication changes at this time.    If she is not admitted, I will plan to set up follow-up as an outpatient, and plan on outpatient echocardiogram and myocardial perfusion study.    Please free to contact me with questions.    Reason for Consultation  Wide-complex tachycardia    History of Present Illness  This is a 35 y.o. female patient with a past medical history of ovarian cyst, chronic back pain with multiple surgeries, hip pain, chronic leukocytosis, chronic thrombocytosis, tobacco use in the past though she quit approximately 2 months ago, with anxiety.  She is also had a prior hysterectomy.    She went to the ED with progressive hip pain.  She is currently undergoing further evaluation.    Otherwise she has had some episodes of chest pain in the past, she has not had chest pain recently.  She denies progressive shortness of breath, orthopnea, PND, lower extremity edema, palpitations, syncope, presyncope, claudication, or TIA or stroke type symptoms.    She does report having a stress test in in [place] approximately 3 years ago that was negative for ischemia.    In reviewing telemetry she does appear to be in mostly sinus tachycardia, when her heart rates above 110 or so, does develop more wide-complex QRS complex, I think this is consistent with a rate dependent bundle branch block.  It does have a left bundle branch morphology.    The last ECG in the system was on 12/10/2019, it showed normal sinus rhythm with heart rate of 92, there is a intraventricular conduction delay, there were nonspecific T wave changes.    The initial ECG when she was particularly tachycardic at 132 bpm showed sinus tachycardia with an underlying intraventricular conduction delay with a left bundle branch morphology.  Her most recent echocardiogram after that was approximately 2 hours later, it showed sinus tachycardia with a heart rate of 109, there was an incomplete right bundle branch block, there were fairly prominent T wave inversions in the anteroseptal leads.    Her hemoglobin is 13.0, platelet count 520, white count 17.1, MCV is 81.5.  Sodium is 137, potassium 4.2, chloride 98, bicarb 29, BUN 8, creatinine 0.82, glucose 73.  Calcium 9.3, magnesium 2.0, total bili 0.3, calcium 7.7, phosphorus 4.8.  Her TSH was 1.25.  AST 17, AST 15, alk phos 117.  Her troponin was 0.02.    She had a plain film of her hip which showed no acute changes.  Her CT of the abdomen pelvis showed no acute process.  There were similar postop changes in the lumbar sacral interspace, an MRI could be considered.  There is diffuse hepatic steatosis, there is moderate colonic stool.    Complete remainder of review of symptoms is negative and/or normal except as documented below.      Past Medical History  Medical History:   Diagnosis Date   ? Ovarian cyst          Social History  Social History     Socioeconomic History   ? Marital status: Single   ? Number of children: 2 Tobacco Use   ? Smoking status: Current Every Day Smoker     Packs/day: 1.50     Years: 11.00     Pack years: 16.50     Types: Cigarettes   ? Smokeless tobacco: Never Used   Vaping Use   ? Vaping Use: Never used   Substance and Sexual Activity   ? Alcohol use: Not Currently   ? Drug use: No         Family History  History reviewed. No pertinent family history.    Home Medications  (Not in a hospital admission)      Current Medications  lidocaine (LIDODERM) 5 % topical patch 1 patch, 1 patch, Topical, ONCE          Allergies  Allergies   Allergen Reactions   ? Pcn [Penicillins] RASH       Review of Systems   General: negative/normal.  Eyes:  negative/normal.  Ears/Nose/Throat:  negative/normal.  Cardiovascular: Occasional chest pain  Respiratory: negative/normal.    Gastrointestinal:  negative/normal.  Genitourinary:  negative/normal.  Musculoskeletal: Back and hip pain  Skin: negative/normal.   Neurologic:  negative/normal.  Psychiatric:  negative/normal.  Endocrine:  negative/normal.  Heme/Lymphatic: negative/normal.  Allergic/Immunologic:  negative/normal.                             Vital Signs: Most Recent                 Vital Signs: 24 Hour Range   BP: 123/99 (11/10 1500)  Temp: 36.7 ?C (98 ?F) (11/10 1111)  Pulse: 112 (11/10 1500)  Respirations: 14 PER MINUTE (11/10 1500)  SpO2: 98 % (11/10 1500)  O2 Device: None (Room air) (11/10 1111)  SpO2 Pulse: 112 (11/10 1500) BP: (123-127)/(89-99)   Temp:  [36.7 ?C (98 ?F)]   Pulse:  [112-143]   Respirations:  [10 PER MINUTE-20 PER MINUTE]   SpO2:  [98 %-100 %]   O2 Device: None (Room air)     Vitals:    07/28/21 1118   Weight: 73 kg (161 lb)     No intake or output data in the 24 hours ending 07/28/21 1546          Physical Exam      General Appearance: no acute distress  HEENT: EOMI, MM-moist, post OP-clear  Neck: no JVD, no carotid bruits  RESP: lungs CTAB, no rales, rhonchi, or wheezing  Cardiac Rhythm: regular rhythm & normal rate  Cardiac Auscultation: Normal S1 & S2, no S3 or S4, no rub  Murmurs: no cardiac murmurs  Abdominal Exam: soft, non-tender, normal bowel sounds, no  masses or bruits  Liver & Spleen: no organomegaly  Skin: warm & intact. No c/c/e  Neurologic Exam: oriented to time, place and person; no focal neurologic deficits      Labs  Results for orders placed or performed during the hospital encounter of 07/28/21 (from the past 24 hour(s))   CBC AND DIFF    Collection Time: 07/28/21 12:45 PM   Result Value Ref Range    White Blood Cells 17.1 (H) 4.5 - 11.0 K/UL    RBC 4.92 4.0 - 5.0 M/UL    Hemoglobin 13.0 12.0 - 15.0 GM/DL    Hematocrit 16.1 36 - 45 %    MCV 81.5 80 - 100 FL    MCH 26.4 26 - 34 PG    MCHC 32.4 32.0 - 36.0 G/DL    RDW 09.6 (H) 11 - 15 %    Platelet Count 520 (H) 150 - 400 K/UL    MPV 7.3 7 - 11 FL    Neutrophils 76 41 - 77 %    Lymphocytes 18 (L) 24 - 44 %    Monocytes 4 4 - 12 %    Eosinophils 1 0 - 5 %    Basophils 1 0 - 2 %    Absolute Neutrophil Count 12.98 (H) 1.8 - 7.0 K/UL    Absolute Lymph Count 3.11 1.0 - 4.8 K/UL    Absolute Monocyte Count 0.69 0 - 0.80 K/UL    Absolute Eosinophil Count 0.22 0 - 0.45 K/UL    Absolute Basophil Count 0.10 0 - 0.20 K/UL    MDW (Monocyte Distribution Width) 21.4 (H) <20.7   COMPREHENSIVE METABOLIC PANEL    Collection Time: 07/28/21 12:45 PM   Result Value Ref Range    Sodium 137 137 - 147 MMOL/L    Potassium 4.2 3.5 - 5.1 MMOL/L    Chloride 98 98 - 110 MMOL/L    Glucose 73 70 - 100 MG/DL    Blood Urea Nitrogen 8 7 - 25 MG/DL    Creatinine 0.45 0.4 - 1.00 MG/DL    Calcium 9.4 8.5 - 40.9 MG/DL    Total Protein 7.7 6.0 - 8.0 G/DL    Total Bilirubin 0.3 0.3 - 1.2 MG/DL    Albumin 4.2 3.5 - 5.0 G/DL    Alk Phosphatase 811 (H) 25 - 110 U/L    AST (SGOT) 17 7 - 40 U/L    CO2 29 21 - 30 MMOL/L    ALT (SGPT) 15 7 - 56 U/L    Anion Gap 10 3 - 12    eGFR >60 >60 mL/min   MAGNESIUM    Collection Time: 07/28/21 12:45 PM   Result Value Ref Range    Magnesium 2.0 1.6 - 2.6 mg/dL   PHOSPHORUS    Collection Time: 07/28/21 12:45 PM   Result Value Ref Range    Phosphorus 4.8 (H) 2.0 - 4.5 MG/DL   TSH WITH FREE T4 REFLEX    Collection Time: 07/28/21 12:45 PM   Result Value Ref Range    TSH 1.25 0.35 - 5.00 MCU/ML   POC TROPONIN    Collection Time: 07/28/21  2:02 PM   Result Value Ref Range    Troponin-I-POC 0.00 0.00 - 0.05 NG/ML     Telemetry shows mostly sinus tachycardia.  When her heart rates are above 110, she tends to have a wider complex rhythm, I believe this is a rate dependent bundle.  There are times when it is  more narrow complex when her rates are slower.    Delane Ginger, MD

## 2021-07-28 NOTE — Progress Notes
1705: [facility] notified this RN that patient is refusing labs. Patient states she is experiencing high levels of anxiety at this time due to pain. Patient requesting to leave AMA. Family member at bedside was able to redirect patient.

## 2021-07-29 ENCOUNTER — Encounter: Admit: 2021-07-29 | Discharge: 2021-07-29 | Payer: MEDICAID

## 2021-07-29 ENCOUNTER — Observation Stay: Admit: 2021-07-29 | Discharge: 2021-07-29 | Payer: MEDICAID

## 2021-07-29 MED ADMIN — PERFLUTREN LIPID MICROSPHERES 1.1 MG/ML IV SUSP [79178]: 2 mL | INTRAVENOUS | @ 16:00:00 | Stop: 2021-07-29 | NDC 11994001116

## 2021-07-29 MED ADMIN — GABAPENTIN 400 MG PO CAP [18307]: 400 mg | ORAL | @ 16:00:00 | Stop: 2021-07-29 | NDC 00904666761

## 2021-07-29 MED ADMIN — ESCITALOPRAM OXALATE 20 MG PO TAB [86690]: 20 mg | ORAL | @ 16:00:00 | Stop: 2021-07-29 | NDC 00904642761

## 2021-07-29 MED ADMIN — BUSPIRONE 5 MG PO TAB [9324]: 7.5 mg | ORAL | @ 16:00:00 | Stop: 2021-07-29 | NDC 00904712261

## 2021-07-29 NOTE — Progress Notes
07/28/21 2200   Vitals   Pulse (!) 148   BP 119/86   Mean NBP (Calculated) 97 MM HG   BP Source Arm, Right Upper     Pt's HR sustaining at 130s-140s while resting in bed. She reported that her pain  to her left hip is better. Denying chest pain nor SOA. Tele monitoring showing ST with wide QRS. BP= 119/86. Informed Dr. Maretta Los, received orders for labs, EKG and IV fluids.

## 2021-07-29 NOTE — Progress Notes
Pt was highly anxious at the beginning of the shift. She became angry when she learned that she could not have caffeine and NPO p MN due to a scheduled Stress test in am. Pt voiced frustrations of "not being told earlier". HR was going up to 130s-150s. Pt reporting pain at 10/10. PRN pain meds given. Informed Dr. Toni Arthurs of pt's anxiety. She reported that she usually takes Valium at home. Received order for Valium 4 mg po x 1.

## 2021-08-10 ENCOUNTER — Encounter: Admit: 2021-08-10 | Discharge: 2021-08-10 | Payer: MEDICAID

## 2021-08-10 DIAGNOSIS — Z720 Tobacco use: Secondary | ICD-10-CM

## 2021-08-10 DIAGNOSIS — I454 Nonspecific intraventricular block: Secondary | ICD-10-CM

## 2021-08-10 DIAGNOSIS — I471 Supraventricular tachycardia: Secondary | ICD-10-CM

## 2021-08-10 NOTE — Telephone Encounter
-----   Message from Delane Ginger, MD sent at 07/29/2021  6:41 PM CST -----  Could we find a follow-up spot for Rhonda Prince.  I saw her during the hospitalization, she had a rate dependent bundle branch block.  Her echocardiogram was stable.  I was planning on getting a vasodilator with regadenoson myocardial perfusion study, though she was having a lot of back and hip pain and declined.  If we could call her to set up a myocardial perfusion study prior to her follow-up office visit, I think that would be helpful.  The indication is a rate dependent left bundle branch block.  I realize there may be a delay given the shortage of technetium.    SWB

## 2021-08-17 ENCOUNTER — Encounter: Admit: 2021-08-17 | Discharge: 2021-08-17 | Payer: MEDICAID

## 2021-08-17 NOTE — Telephone Encounter
-----   Message from Kathie Rhodes, PA-C sent at 08/17/2021  4:00 PM CST -----  Regarding: RE: P2P needed  Received a call back and reviewed the consult note that was not privy to Dr Otho Bellows.  They will send over PA to proceed with MPI as requested.     Thx  Jody  ----- Message -----  From: Laroy Apple, RN  Sent: 08/17/2021   3:36 PM CST  To: Laroy Apple, RN, Kathie Rhodes, PA-C  Subject: P2P needed                                         08/17/2021 3:33 PM   Pt needs P2P on MPI this week. [facility] rounding and unable to do it this week. Can you assist?  Phone [phone]  Dr. Otho Bellows  Case# [id number]    Pt with recent hospital stay.   1. 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  2. T wave inversions on ECG at a lower heart rate.    Thank you!!

## 2021-08-19 ENCOUNTER — Encounter: Admit: 2021-08-19 | Discharge: 2021-08-19 | Payer: MEDICAID

## 2021-08-19 NOTE — Telephone Encounter
08-19-21 Per Task Message, request faxed to [facility], (F) [phone], (P) [phone], requested records go to [employer], clp

## 2021-08-19 NOTE — Telephone Encounter
08-19-21, Per Task Message, request faxed to [facility], (F) [phone], (P) [phone], requested records go to [employer], clp

## 2021-08-31 ENCOUNTER — Encounter: Admit: 2021-08-31 | Discharge: 2021-08-31 | Payer: MEDICAID

## 2021-08-31 NOTE — Telephone Encounter
Left message with date/time/location and instructions for stress test.

## 2021-09-02 ENCOUNTER — Encounter: Admit: 2021-09-02 | Discharge: 2021-09-02 | Payer: MEDICAID

## 2021-09-02 NOTE — Telephone Encounter
Pt had trouble with transportation rescheduled stress test.

## 2021-09-20 ENCOUNTER — Encounter: Admit: 2021-09-20 | Discharge: 2021-09-20 | Payer: MEDICAID

## 2021-09-22 ENCOUNTER — Encounter: Admit: 2021-09-22 | Discharge: 2021-09-22 | Payer: MEDICAID

## 2021-09-22 ENCOUNTER — Observation Stay: Admit: 2021-09-22 | Discharge: 2021-09-23 | Payer: MEDICAID

## 2021-09-22 ENCOUNTER — Emergency Department: Admit: 2021-09-22 | Discharge: 2021-09-22 | Payer: MEDICAID

## 2021-09-22 DIAGNOSIS — M25559 Pain in unspecified hip: Secondary | ICD-10-CM

## 2021-09-22 LAB — CBC AND DIFF
ABSOLUTE BASO COUNT: 0.1 K/UL (ref 0–0.20)
ABSOLUTE EOS COUNT: 0.4 K/UL (ref 0–0.45)
ABSOLUTE LYMPH COUNT: 2.9 K/UL (ref 1.0–4.8)
ABSOLUTE MONO COUNT: 0.7 K/UL (ref 0–0.80)
ABSOLUTE NEUTROPHIL: 11 K/UL — ABNORMAL HIGH (ref 1.8–7.0)
BASOPHILS %: 1 % (ref >60)
EOSINOPHILS %: 3 % — ABNORMAL HIGH (ref 0–5)
HEMATOCRIT: 38 % — ABNORMAL LOW (ref 36–45)
MCH: 27 pg (ref 26–34)
MCHC: 32 g/dL (ref 32.0–36.0)
MCV: 83 FL (ref 80–100)
MDW (MONOCYTE DISTRIBUTION WIDTH): 21 — ABNORMAL HIGH (ref <20.7)
MONOCYTES %: 5 % (ref 4–12)
MPV: 8.2 FL (ref 7–11)
PLATELET COUNT: 406 K/UL — ABNORMAL HIGH (ref 150–400)
RBC COUNT: 4.6 M/UL (ref 4.0–5.0)
RDW: 16 % — ABNORMAL HIGH (ref 11–15)
WBC COUNT: 15 K/UL — ABNORMAL HIGH (ref 4.5–11.0)

## 2021-09-22 LAB — COMPREHENSIVE METABOLIC PANEL
AST: 20 U/L (ref 7–40)
CO2: 26 MMOL/L — ABNORMAL LOW (ref 21–30)
GLUCOSE,PANEL: 62 mg/dL — ABNORMAL LOW (ref 70–100)

## 2021-09-22 MED ORDER — PANTOPRAZOLE 40 MG PO TBEC
40 mg | Freq: Every day | ORAL | 0 refills | Status: CN
Start: 2021-09-22 — End: ?

## 2021-09-22 MED ORDER — POLYETHYLENE GLYCOL 3350 17 GRAM PO PWPK
1 | Freq: Every day | ORAL | 0 refills | Status: CN | PRN
Start: 2021-09-22 — End: ?

## 2021-09-22 MED ORDER — FENTANYL CITRATE (PF) 50 MCG/ML IJ SOLN
50 ug | Freq: Once | INTRAVENOUS | 0 refills | Status: DC
Start: 2021-09-22 — End: 2021-09-23

## 2021-09-22 MED ORDER — ONDANSETRON 4 MG PO TBDI
4 mg | ORAL | 0 refills | Status: CN | PRN
Start: 2021-09-22 — End: ?

## 2021-09-22 MED ORDER — ACETAMINOPHEN 500 MG PO TAB
1000 mg | Freq: Once | ORAL | 0 refills | Status: CP
Start: 2021-09-22 — End: ?
  Administered 2021-09-22: 23:00:00 1000 mg via ORAL

## 2021-09-22 MED ORDER — OXYCODONE 5 MG PO TAB
7.5 mg | ORAL | 0 refills | Status: CN | PRN
Start: 2021-09-22 — End: ?

## 2021-09-22 MED ORDER — MORPHINE 4 MG/ML IV SYRG
6 mg | Freq: Once | INTRAVENOUS | 0 refills | Status: CP
Start: 2021-09-22 — End: ?
  Administered 2021-09-22: 22:00:00 4 mg via INTRAVENOUS

## 2021-09-22 MED ORDER — DIAZEPAM 5 MG PO TAB
5 mg | Freq: Two times a day (BID) | ORAL | 0 refills | Status: CN | PRN
Start: 2021-09-22 — End: ?

## 2021-09-22 MED ORDER — IMS MIXTURE TEMPLATE
400 mg | Freq: Three times a day (TID) | ORAL | 0 refills | Status: CN
Start: 2021-09-22 — End: ?

## 2021-09-22 MED ORDER — CYCLOBENZAPRINE 10 MG PO TAB
10 mg | Freq: Three times a day (TID) | ORAL | 0 refills | Status: CN | PRN
Start: 2021-09-22 — End: ?

## 2021-09-22 MED ORDER — LIDOCAINE 5 % TP PTMD
1 | Freq: Every day | TOPICAL | 0 refills | Status: DC
Start: 2021-09-22 — End: 2021-09-23

## 2021-09-22 MED ORDER — BUSPIRONE 5 MG PO TAB
7.5 mg | Freq: Two times a day (BID) | ORAL | 0 refills | Status: CN
Start: 2021-09-22 — End: ?

## 2021-09-22 MED ORDER — MELATONIN 5 MG PO TAB
5 mg | Freq: Every evening | ORAL | 0 refills | Status: CN | PRN
Start: 2021-09-22 — End: ?

## 2021-09-22 MED ORDER — SENNOSIDES-DOCUSATE SODIUM 8.6-50 MG PO TAB
1 | Freq: Every day | ORAL | 0 refills | Status: CN | PRN
Start: 2021-09-22 — End: ?

## 2021-09-22 MED ORDER — FENTANYL CITRATE (PF) 50 MCG/ML IJ SOLN
100 ug | Freq: Once | INTRAVENOUS | 0 refills | Status: CP
Start: 2021-09-22 — End: ?
  Administered 2021-09-23: 100 ug via INTRAVENOUS

## 2021-09-22 MED ORDER — PROPRANOLOL 80 MG PO TAB
80 mg | Freq: Two times a day (BID) | ORAL | 0 refills | Status: CN
Start: 2021-09-22 — End: ?

## 2021-09-22 MED ORDER — KETOROLAC 15 MG/ML IJ SOLN
15 mg | Freq: Once | INTRAVENOUS | 0 refills | Status: CP
Start: 2021-09-22 — End: ?
  Administered 2021-09-22: 15 mg via INTRAVENOUS

## 2021-09-22 MED ORDER — ESCITALOPRAM OXALATE 10 MG PO TAB
20 mg | Freq: Every day | ORAL | 0 refills | Status: CN
Start: 2021-09-22 — End: ?

## 2021-09-22 MED ORDER — ONDANSETRON HCL (PF) 4 MG/2 ML IJ SOLN
4 mg | INTRAVENOUS | 0 refills | Status: CN | PRN
Start: 2021-09-22 — End: ?

## 2021-09-22 MED ORDER — IBUPROFEN 800 MG PO TAB
800 mg | Freq: Three times a day (TID) | ORAL | 0 refills | Status: CN
Start: 2021-09-22 — End: ?

## 2021-09-22 MED ADMIN — MORPHINE 2 MG/ML IV SYRG [320653]: 2 mg | INTRAVENOUS | @ 22:00:00 | Stop: 2021-09-22 | NDC 00409189003

## 2021-09-22 NOTE — ED Notes
36 y/o female presents to [facility] w/ CC hip pain. The pt reports hx of chronic hip pain and states that she takes oxycodone, ibuprofen, and flexeril to manage the pain. She states the pain has gotten worse over the last two days and her pain meds have not been sufficient. The pain is located in bilateral hips and is worsened by putting weight on her legs. The pt also reports being unable to ambulate over the past two days d/t the pain. She denies any other symptoms today.    The pt is A&Ox4. Breathing is even and non-labored on RA. Skin is warm, dry, and appropriate for ethnicity. The pt is resting with cart in lowest locked position and call light in reach.    Medical History:   Diagnosis Date    Ovarian cyst

## 2021-09-23 ENCOUNTER — Encounter: Admit: 2021-09-23 | Discharge: 2021-09-23 | Payer: MEDICAID

## 2021-09-23 NOTE — ED Notes
Pt advised that she wanted to leave and did not want to be admitted. Dr. Tresa Endo was advised of this. Pt signed AMA formed and IV was removed. Pt left ED with family member and walked out with a steady gait.

## 2021-09-23 NOTE — Discharge Instructions - Pharmacy
Discharge Summary      Name: Rhonda Prince  Medical Record Number: [id number]        Account Number:  [id number]  Date Of Birth:  October 03, 1985                         Age:  36 y.o.  Admit date:  09/22/2021                     Discharge date: 09/22/2021      Discharge Attending:  Yolanda Manges, DO  Discharge Summary Completed By: Macky Lower, DO    Service: Med Private Swing 5 [phone]    Reason for hospitalization:  Hip pain [M25.559]    Primary Discharge Diagnosis:   Bursitis    Hospital Diagnoses:  Hospital Problems        Active Problems    * (Principal) Hip pain     Significant Past Medical History        Ovarian cyst    Allergies   Pcn [penicillins]    Brief Hospital Course   The patient was admitted and the following issues were addressed during this hospitalization: (with pertinent details including admission exam/imaging/labs).      36 yo female with a history of chronic low back and chronic left hip pain presented to the ED for severe left hip pain.  While in the ED, she received a few doses of IV opiates, Fentanyl IV was last given.  She refused lidoderm patch.      On exam, her pain was consistent with acute bursitis.  I discussed the diagnosis and treatment with patient and her father at the bedside.  I offered Pain Anesthesia consultation in the AM for bursa injection.  I informed her that this kind of musculoskeletal pain does not require IV opiates and that I would continue her home po regimen.  Initially she was agreeable to this, however a short time later, I was notified that patient was leaving AMA.      Items Needing Follow Up   Pending items or areas that need to be addressed at follow up: None    Pending Labs and Follow Up Radiology    Pending labs and/or radiology review at this time of discharge are listed below: if this area is blank, there are no items for review.   Pending Labs     Order Current Status    CBC AND DIFF In process    COMPREHENSIVE METABOLIC PANEL In process Medications   Medications not reconciled as patient left ama    Return Appointments and Scheduled Appointments     Scheduled appointments:    Sep 26, 2021  9:00 AM  Regadenoson Stress Test with [facility]  Cardiology: Mendel Ryder ([facility]) [address]  [address] [place] [address]  [phone]          Consults, Procedures, Diagnostics, Micro, Pathology   Consults: None  Surgical Procedures & Dates: None  Significant Diagnostic Studies, Micro and Procedures:   - Hip X-ray  1. ?No acute osseous abnormality identified.   2. ?Mild degenerative change of the hip with mild spurring off the lateral   femoral head. Slight fullness of the femoral head neck junction.   Significant Pathology: none  Nutrition: No Dietitian Consult    Discharge Disposition, Condition   Patient Disposition: Left Against Medical Advice [07]  Condition at Discharge: AMA    Code Status  Code Status History     Date Active Date Inactive Code Status Order ID          07/28/2021 [id number] 07/29/2021 1328 Full Code [id number]  Murvin Natal, DO ED    07/11/2020 2131 07/14/2020 1049 Full Code [id number]  Mollie Germany, MD ED    Only showing the last 2 code statuses.          Patient Instructions   No instructions were given as patient left AMA    Additional Orders: Case Management, Supplies, Home Health     Home Health/DME     None            Signed:  Macky Lower, DO  09/22/2021      cc:  Primary Care Physician:  No Pcp, Na   Verified    Referring physicians:  Self, Referral   Additional provider(s):        Did we miss something? If additional records are needed, please fax a request on office letterhead to [phone]. Please include the patient's name, date of birth, fax number and type of information needed. Additional request can be made by email at [email or web address]. For general questions of information about electronic records sharing, call [phone].

## 2021-09-23 NOTE — H&P (View-Only)
Admission History and Physical Examination      Name:  Rhonda Prince                                             MRN:  [id number]   Admission Date:  09/22/2021                     Assessment/Plan:    Principal Problem:    Hip pain    36 year old Caucasian female with with chronic back pain that presented to the ED with left hip pain that is intractable  ?  Left hip pain, intractable- suspect bursitis  Chronic back pain  -pain is reproducible over left bursa, she has no tenderness to palpation of the hip joint  - suspect bursitis  - Hip X-ray  1. ?No acute osseous abnormality identified.   2. ?Mild degenerative change of the hip with mild spurring off the lateral   femoral head. Slight fullness of the femoral head neck junction.   - continue outpatient pain regimen  - toradol prn  - consult Pain Anesthesia for consideration of injection  ?  FEN  - regular diet  - no IVF    Ppx  - SCDs only for possible injection tomorrow    Disp  - Observation, likely DC tomorrow    __________________________________________________________________________________  Primary Care Physician: No Pcp, Na  Verified    Chief Complaint:  Left hip pain  History of Present Illness: Rhonda Prince is a 36 y.o. female who presented to the ED today for ongoing severe left hip pain.  She has no recent trauma, but states that she is unable to ambulate.  Pain is in lateral aspect of hip, pain is worse with walking or touching.  Sometimes it is relieved with laying down.  She follows with an outpatient pain clinic, but states that she has never had any injections for her hip.     She has no fever, chills, chest pain, dyspnea.      Medical History:   Diagnosis Date   ? Ovarian cyst      Surgical History:   Procedure Laterality Date   ? HX APPENDECTOMY     ? HX HYSTERECTOMY       Family history reviewed; non-contributory  Social History     Tobacco Use   ? Smoking status: Every Day     Packs/day: 1.50     Years: 11.00     Pack years: 16.50     Types: Cigarettes   ? Smokeless tobacco: Never   Vaping Use   ? Vaping Use: Never used   Substance and Sexual Activity   ? Alcohol use: Not Currently   ? Drug use: No             Immunizations (includes history and patient reported):   Immunization History   Administered Date(s) Administered   ? Flu Vaccine =>6 Months Quadrivalent PF 07/12/2020   ? Pneumococcal Vaccine (23-Val Adult) 01/30/2012           Allergies:  Pcn [penicillins]    Medications:  Current Facility-Administered Medications   Medication   ? lidocaine (LIDODERM) 5 % topical patch 1 patch     Current Outpatient Medications   Medication Sig   ? busPIRone (VANSPAR) 7.5 mg tablet Take 7.5 mg by mouth twice daily.   ?  cyclobenzaprine (FLEXERIL) 10 mg tablet Take 10 mg by mouth three times daily as needed.   ? diazePAM (VALIUM) 5 mg tablet Take 5 mg by mouth twice daily as needed for Anxiety.   ? escitalopram oxalate (LEXAPRO) 20 mg tablet Take 20 mg by mouth daily.   ? gabapentin (NEURONTIN) 400 mg capsule Take 400 mg by mouth three times daily.   ? ibuprofen (MOTRIN) 800 mg tablet Take 1 tablet by mouth three times daily.   ? omeprazole DR(+) (PRILOSEC) 20 mg capsule Take 20 mg by mouth daily before breakfast.   ? oxycodone HCl (OXYCODONE PO) Take 7.5 mg by mouth every 6 hours as needed.   ? polyethylene glycol 3350 (MIRALAX) 17 g packet Take 17 g by mouth twice daily as needed.   ? propranoloL (INDERAL) 80 mg tablet Take 80 mg by mouth twice daily.     Review of Systems:  A 14 point review of systems was negative except for: severe left hip pain    Physical Exam:  Vital Signs: Last Filed In 24 Hours Vital Signs: 24 Hour Range   BP: 114/93 (01/05 1800)  Temp: 36.4 ?C (97.5 ?F) (01/05 1327)  Pulse: 132 (01/05 1327)  Respirations: 18 PER MINUTE (01/05 1111)  SpO2: 99 % (01/05 1730)  O2 Device: None (Room air) (01/05 1327)  SpO2 Pulse: 97 (01/05 1800)  Height: 167.6 cm (5' 6) (01/05 1111) BP: (114-145)/(85-109)   Temp: [36.4 ?C (97.5 ?F)]   Pulse:  [119-132]   Respirations:  [18 PER MINUTE]   SpO2:  [96 %-100 %]   O2 Device: None (Room air)   Intensity Pain Scale (Self Report): 10 (09/22/21 1113)      General:  Alert, cooperative, no distress, appears stated age and She appear in no acute distress  Head:  Normocephalic, without obvious abnormality, atraumatic  Eyes:  eomi, no scleral icterus  Throat: MMM  Lungs:  Clear to auscultation bilaterally  Heart:   Regular rate and rhythm, S1, S2 normal, no murmur, click rub or gallop  Abdomen:  Soft, non-tender.  Bowel sounds normal.  No masses.  No organomegaly.  Extremities: left hip with severe tenderness over greater tuberosity, no tenderness to palpation over hip joint  Peripheral pulses   2+ and symmetric, all extremities  Skin: Skin color, texture, turgor normal.  No rashes or lesions      Lab/Radiology/Other Diagnostic Tests:  24-hour labs:  No results found for this visit on 09/22/21 (from the past 24 hour(s)).     Pertinent radiology reviewed.    Macky Lower, DO

## 2021-09-23 NOTE — Telephone Encounter
Called patient to confirm D-T-L of stress test. Patient states that she is unable to make appointment right now do to hip pain. Patient states that she would like for someone to reach out to her next week to reschedule stress test.

## 2021-09-30 ENCOUNTER — Encounter: Admit: 2021-09-30 | Discharge: 2021-09-30 | Payer: MEDICAID

## 2021-09-30 NOTE — Telephone Encounter
Spoke with patient regarding regadenoson stress test scheduled for 1/18 at 8:30. Instructed to arrive at the [facility] at the [facility] location at 8:15 for 8:30 stress test. Instructed to have nothing to eat or drink after midnight the night before the test on 1/17. Instructed to avoid all caffeine products for 24 hours prior to the test, which includes all decafs, chocolates, teas, and colas. Instructed to take all normal medications with a sip of water the morning of the test, but to hold all vitamins and supplements until after the test. Patient denies any further questions or concerns at this time.

## 2021-10-07 ENCOUNTER — Encounter: Admit: 2021-10-07 | Discharge: 2021-10-07 | Payer: MEDICAID

## 2021-10-07 NOTE — Telephone Encounter
Spoke with patient regarding Regadenoson Stress Test scheduled for 1/23 at 9:30. Instructed to arrive at the [facility] at the [facility] location at 9:15 for 9:30 stress test. Instructed to have nothing to eat or drink after midnight on Sunday, 1/22. Instructed to avoid all caffeine products for 24 hours prior to the test. Instructed to take all normal medications with a sip of water the morning of the test. Instructed to wear loose, comfortable clothing, and to avoid all oils and lotions on the chest the morning of the test. No further questions or concerns at this time.

## 2022-02-13 ENCOUNTER — Encounter: Admit: 2022-02-13 | Discharge: 2022-02-13 | Payer: MEDICAID

## 2022-02-13 ENCOUNTER — Emergency Department: Admit: 2022-02-13 | Discharge: 2022-02-13 | Payer: MEDICAID

## 2022-02-13 ENCOUNTER — Observation Stay: Admit: 2022-02-13 | Discharge: 2022-02-13 | Payer: MEDICAID

## 2022-02-13 DIAGNOSIS — R079 Chest pain, unspecified: Secondary | ICD-10-CM

## 2022-02-13 DIAGNOSIS — I959 Hypotension, unspecified: Secondary | ICD-10-CM

## 2022-02-13 DIAGNOSIS — Z9889 Other specified postprocedural states: Secondary | ICD-10-CM

## 2022-02-13 DIAGNOSIS — N83209 Unspecified ovarian cyst, unspecified side: Secondary | ICD-10-CM

## 2022-02-13 DIAGNOSIS — M549 Dorsalgia, unspecified: Secondary | ICD-10-CM

## 2022-02-13 DIAGNOSIS — R072 Precordial pain: Secondary | ICD-10-CM

## 2022-02-13 DIAGNOSIS — R6510 Systemic inflammatory response syndrome (SIRS) of non-infectious origin without acute organ dysfunction: Secondary | ICD-10-CM

## 2022-02-13 DIAGNOSIS — I2510 Atherosclerotic heart disease of native coronary artery without angina pectoris: Secondary | ICD-10-CM

## 2022-02-13 LAB — COMPREHENSIVE METABOLIC PANEL
ALBUMIN: 4.2 g/dL (ref 3.5–5.0)
ALBUMIN: 3.1 g/dL — ABNORMAL LOW (ref 3.5–5.0)
ALK PHOSPHATASE: 118 U/L — ABNORMAL HIGH (ref 25–110)
ALK PHOSPHATASE: 89 U/L (ref 25–110)
ALT: 15 U/L (ref 7–56)
ALT: 10 U/L (ref 7–56)
ANION GAP: 11 K/UL — ABNORMAL HIGH (ref 3–12)
ANION GAP: 8 (ref 3–12)
AST: 17 U/L (ref 7–40)
AST: 12 U/L (ref 7–40)
BLD UREA NITROGEN: 5 mg/dL — ABNORMAL LOW (ref 7–25)
CALCIUM: 9.3 mg/dL — ABNORMAL HIGH (ref 8.5–10.6)
CALCIUM: 8.1 mg/dL — ABNORMAL LOW (ref 8.5–10.6)
CHLORIDE: 109 MMOL/L (ref 98–110)
CO2: 27 MMOL/L (ref 21–30)
CO2: 23 MMOL/L (ref 21–30)
CREATININE: 0.7 mg/dL (ref 0.4–1.00)
CREATININE: 0.5 mg/dL (ref 0.4–1.00)
EGFR: >60 mL/min (ref >60)
EGFR: >60 mL/min (ref >60)
GLUCOSE,PANEL: 91 mg/dL (ref 70–100)
POTASSIUM: 3.9 MMOL/L (ref 3.5–5.1)
SODIUM: 138 MMOL/L (ref 137–147)
SODIUM: 140 MMOL/L (ref 137–147)
TOTAL BILIRUBIN: 0.4 mg/dL (ref 0.3–1.2)
TOTAL BILIRUBIN: 0.4 mg/dL (ref 0.3–1.2)
TOTAL PROTEIN: 7.3 g/dL — ABNORMAL HIGH (ref 6.0–8.0)
TOTAL PROTEIN: 5.6 g/dL — ABNORMAL LOW (ref 6.0–8.0)

## 2022-02-13 LAB — RVP VIRAL PANEL PCR

## 2022-02-13 LAB — POC LACTATE
LACTIC ACID POC: 1.8 MMOL/L (ref 0.5–2.0)
LACTIC ACID POC: 1.1 MMOL/L (ref 0.5–2.0)

## 2022-02-13 LAB — HIGH SENSITIVITY TROPONIN I 0 HOUR: HIGH SENSITIVITY TROPONIN I 0 HOUR: 40 ng/L — ABNORMAL HIGH (ref <12)

## 2022-02-13 LAB — CBC
HEMATOCRIT: 33 % — ABNORMAL LOW (ref 36–45)
HEMOGLOBIN: 10 g/dL — ABNORMAL LOW (ref 12.0–15.0)
MPV: 8.4 FL (ref 7–11)
PLATELET COUNT: 371 K/UL (ref 150–400)
RBC COUNT: 4.1 M/UL (ref 4.0–5.0)
RDW: 15 % — ABNORMAL HIGH (ref 11–15)
WBC COUNT: 15 K/UL — ABNORMAL HIGH (ref 4.5–11.0)

## 2022-02-13 LAB — PTT (APTT): PTT: 38 s — ABNORMAL HIGH (ref 24.0–36.5)

## 2022-02-13 LAB — HIGH SENSITIVITY TROPONIN I 2 HOUR: HIGH SENSITIVITY TROPONIN I 2 HOUR: 36 ng/L — ABNORMAL HIGH (ref <12)

## 2022-02-13 LAB — CBC AND DIFF
ABSOLUTE BASO COUNT: 0.1 K/UL (ref 0–0.20)
ABSOLUTE EOS COUNT: 0.2 K/UL (ref 0–0.45)
ABSOLUTE MONO COUNT: 1.2 K/UL — ABNORMAL HIGH (ref 0–0.80)
WBC COUNT: 20 K/UL — ABNORMAL HIGH (ref 4.5–11.0)

## 2022-02-13 LAB — BNP POC ER: BNP POC: 54 pg/mL (ref 0–100)

## 2022-02-13 LAB — PROCALCITONIN: PROCALCITONIN: 0 ng/mL

## 2022-02-13 LAB — SED RATE: ESR: 41 mm/h — ABNORMAL HIGH (ref 0–20)

## 2022-02-13 LAB — PROTIME INR (PT): PROTIME: 12 s — ABNORMAL LOW (ref 9.5–14.2)

## 2022-02-13 LAB — D-DIMER: D-DIMER: 388 ng{FEU}/mL — ABNORMAL LOW (ref <500)

## 2022-02-13 LAB — HIGH SENSITIVITY TROPONIN I 4 HR: HI SEN TNI 4 HR: 36 ng/L — ABNORMAL HIGH (ref <12)

## 2022-02-13 LAB — BNP (B-TYPE NATRIURETIC PEPTI): BNP: 25 pg/mL — ABNORMAL LOW (ref 0–100)

## 2022-02-13 LAB — POC TROPONIN: TROPONIN I, POC: 0 ng/mL — ABNORMAL HIGH (ref 0.00–0.05)

## 2022-02-13 LAB — HIGH SENSITIVITY TROPONIN I, RANDOM: HIGH SENSITIVITY TROPONIN I: 33 ng/L — ABNORMAL HIGH (ref <12)

## 2022-02-13 MED ORDER — VANCOMYCIN PHARMACY TO MANAGE
1 | 0 refills | Status: DC
Start: 2022-02-13 — End: 2022-02-13

## 2022-02-13 MED ORDER — ROSUVASTATIN 20 MG PO TAB
10 mg | Freq: Every evening | ORAL | 0 refills | Status: AC
Start: 2022-02-13 — End: ?
  Administered 2022-02-14 – 2022-02-16 (×3): 10 mg via ORAL

## 2022-02-13 MED ORDER — POTASSIUM CHLORIDE 20 MEQ PO TBTQ
40 meq | Freq: Once | ORAL | 0 refills | Status: CP
Start: 2022-02-13 — End: ?
  Administered 2022-02-13: 18:00:00 40 meq via ORAL

## 2022-02-13 MED ORDER — IOHEXOL 350 MG IODINE/ML IV SOLN
70 mL | Freq: Once | INTRAVENOUS | 0 refills | Status: CP
Start: 2022-02-13 — End: ?
  Administered 2022-02-13: 19:00:00 70 mL via INTRAVENOUS

## 2022-02-13 MED ORDER — HYDROMORPHONE (PF) 2 MG/ML IJ SYRG
.5-1 mg | INTRAVENOUS | 0 refills | Status: AC | PRN
Start: 2022-02-13 — End: ?
  Administered 2022-02-14: 01:00:00 1 mg via INTRAVENOUS
  Administered 2022-02-14 (×2): 0.5 mg via INTRAVENOUS

## 2022-02-13 MED ORDER — VENLAFAXINE 150 MG PO CP24
150 mg | Freq: Every day | ORAL | 0 refills | Status: AC
Start: 2022-02-13 — End: ?
  Administered 2022-02-14 – 2022-02-16 (×3): 150 mg via ORAL

## 2022-02-13 MED ORDER — ENOXAPARIN 40 MG/0.4 ML SC SYRG
40 mg | Freq: Every day | SUBCUTANEOUS | 0 refills | Status: AC
Start: 2022-02-13 — End: ?
  Administered 2022-02-14 – 2022-02-16 (×3): 40 mg via SUBCUTANEOUS

## 2022-02-13 MED ORDER — MORPHINE 4 MG/ML IV SYRG
4 mg | Freq: Once | INTRAVENOUS | 0 refills | Status: CP
Start: 2022-02-13 — End: ?
  Administered 2022-02-13: 18:00:00 4 mg via INTRAVENOUS

## 2022-02-13 MED ORDER — LACTATED RINGERS IV SOLP
500 mL | INTRAVENOUS | 0 refills | Status: CP
Start: 2022-02-13 — End: ?
  Administered 2022-02-13: 18:00:00 500 mL via INTRAVENOUS

## 2022-02-13 MED ORDER — ACETAMINOPHEN 500 MG PO TAB
1000 mg | ORAL | 0 refills | Status: AC
Start: 2022-02-13 — End: ?
  Administered 2022-02-14 (×2): 1000 mg via ORAL
  Administered 2022-02-14: 22:00:00 500 mg via ORAL
  Administered 2022-02-14 – 2022-02-16 (×7): 1000 mg via ORAL

## 2022-02-13 MED ORDER — DICLOFENAC SODIUM 1 % TP GEL
4 g | Freq: Four times a day (QID) | TOPICAL | 0 refills | Status: AC
Start: 2022-02-13 — End: ?

## 2022-02-13 MED ORDER — MELATONIN 5 MG PO TAB
5 mg | Freq: Every evening | ORAL | 0 refills | Status: AC | PRN
Start: 2022-02-13 — End: ?
  Administered 2022-02-14 – 2022-02-16 (×3): 5 mg via ORAL

## 2022-02-13 MED ORDER — ACETAMINOPHEN 500 MG PO TAB
500 mg | ORAL | 0 refills | Status: DC | PRN
Start: 2022-02-13 — End: 2022-02-14
  Administered 2022-02-14: 500 mg via ORAL

## 2022-02-13 MED ORDER — OXYCODONE 10 MG PO TAB
10 mg | ORAL | 0 refills | Status: AC | PRN
Start: 2022-02-13 — End: ?
  Administered 2022-02-13 – 2022-02-14 (×3): 10 mg via ORAL

## 2022-02-13 MED ORDER — METOPROLOL TARTRATE 25 MG PO TAB
25 mg | Freq: Two times a day (BID) | ORAL | 0 refills | Status: AC
Start: 2022-02-13 — End: ?
  Administered 2022-02-14 (×2): 25 mg via ORAL

## 2022-02-13 MED ORDER — PANTOPRAZOLE 40 MG PO TBEC
40 mg | Freq: Every day | ORAL | 0 refills | Status: AC
Start: 2022-02-13 — End: ?
  Administered 2022-02-14 – 2022-02-16 (×3): 40 mg via ORAL

## 2022-02-13 MED ORDER — ALPRAZOLAM 0.5 MG PO TAB
.5 mg | Freq: Two times a day (BID) | ORAL | 0 refills | Status: AC | PRN
Start: 2022-02-13 — End: ?
  Administered 2022-02-13 – 2022-02-14 (×2): 0.5 mg via ORAL

## 2022-02-13 MED ORDER — SODIUM CHLORIDE 0.9 % IV SOLP
500 mL | INTRAVENOUS | 0 refills | Status: CP
Start: 2022-02-13 — End: ?
  Administered 2022-02-13: 22:00:00 500 mL via INTRAVENOUS

## 2022-02-13 MED ORDER — VANCOMYCIN PHARMACY TO MANAGE
1 | 0 refills | Status: DC
Start: 2022-02-13 — End: 2022-02-14

## 2022-02-13 MED ORDER — ONDANSETRON HCL (PF) 4 MG/2 ML IJ SOLN
4 mg | Freq: Once | INTRAVENOUS | 0 refills | Status: CP
Start: 2022-02-13 — End: ?
  Administered 2022-02-13: 18:00:00 4 mg via INTRAVENOUS

## 2022-02-13 MED ORDER — ASPIRIN 81 MG PO CHEW
243 mg | Freq: Once | ORAL | 0 refills | Status: CP
Start: 2022-02-13 — End: ?
  Administered 2022-02-13: 18:00:00 243 mg via ORAL

## 2022-02-13 MED ORDER — LACTATED RINGERS IV SOLP
500 mL | INTRAVENOUS | 0 refills | Status: CP
Start: 2022-02-13 — End: ?

## 2022-02-13 MED ORDER — CYCLOBENZAPRINE 10 MG PO TAB
10 mg | Freq: Three times a day (TID) | ORAL | 0 refills | Status: AC
Start: 2022-02-13 — End: ?
  Administered 2022-02-14: 01:00:00 10 mg via ORAL

## 2022-02-13 MED ORDER — HYDROMORPHONE (PF) 2 MG/ML IJ SYRG
.5 mg | Freq: Once | INTRAVENOUS | 0 refills | Status: CP
Start: 2022-02-13 — End: ?
  Administered 2022-02-13: 20:00:00 0.5 mg via INTRAVENOUS

## 2022-02-13 MED ORDER — PRASUGREL 10 MG PO TAB
10 mg | Freq: Every day | ORAL | 0 refills | Status: AC
Start: 2022-02-13 — End: ?
  Administered 2022-02-14 – 2022-02-16 (×3): 10 mg via ORAL

## 2022-02-13 MED ORDER — VANCOMYCIN 1250MG IN 0.9% NACL IVPB (BATCHED)
15 mg/kg | Freq: Two times a day (BID) | INTRAVENOUS | 0 refills | Status: DC
Start: 2022-02-13 — End: 2022-02-14

## 2022-02-13 MED ORDER — VANCOMYCIN 1,750 MG IVPB
20 mg/kg | Freq: Once | INTRAVENOUS | 0 refills | Status: CP
Start: 2022-02-13 — End: ?
  Administered 2022-02-13 (×2): 1750 mg via INTRAVENOUS

## 2022-02-13 MED ORDER — ASPIRIN 81 MG PO TBEC
81 mg | Freq: Every day | ORAL | 0 refills | Status: AC
Start: 2022-02-13 — End: ?
  Administered 2022-02-14 – 2022-02-16 (×3): 81 mg via ORAL

## 2022-02-13 MED ORDER — KETOROLAC 15 MG/ML IJ SOLN
15 mg | INTRAVENOUS | 0 refills | Status: DC | PRN
Start: 2022-02-13 — End: 2022-02-14

## 2022-02-13 MED ORDER — KETOROLAC 15 MG/ML IJ SOLN
15 mg | Freq: Once | INTRAVENOUS | 0 refills | Status: CP
Start: 2022-02-13 — End: ?
  Administered 2022-02-13: 22:00:00 15 mg via INTRAVENOUS

## 2022-02-13 MED ORDER — CEFEPIME 2G/100ML NS IVPB (MB+)
2 g | INTRAVENOUS | 0 refills | Status: DC
Start: 2022-02-13 — End: 2022-02-14
  Administered 2022-02-13 (×2): 2 g via INTRAVENOUS

## 2022-02-13 MED ORDER — NITROGLYCERIN 0.4 MG SL SUBL
.4 mg | Freq: Once | SUBLINGUAL | 0 refills | Status: CP
Start: 2022-02-13 — End: ?
  Administered 2022-02-14: 0.4 mg via SUBLINGUAL

## 2022-02-13 MED ORDER — SODIUM CHLORIDE 0.9 % IJ SOLN
50 mL | Freq: Once | INTRAVENOUS | 0 refills | Status: CP
Start: 2022-02-13 — End: ?
  Administered 2022-02-13: 19:00:00 50 mL via INTRAVENOUS

## 2022-02-13 MED ADMIN — LACTATED RINGERS IV SOLP [4318]: 500 mL | INTRAVENOUS | @ 21:00:00 | Stop: 2022-02-13 | NDC 00338011703

## 2022-02-13 NOTE — Progress Notes
Rapid Response Note    Name:  Rhonda Prince   AOZHY'Q Date:  02/13/2022  Admission Date: 02/13/2022  LOS: 0 days                     Subjective  Rhonda Prince?is a 36 y.o.?female?with significant history of coronary artery disease?s/p?PCI to LAD on 02/11/2022, chronic low back pain, anxiety/depression and obesity?who presented to the hospital for C/C of?chest pain.  Initially medicine team was contacted for admission and at the time of admission, patient became hypotensive and rapid response was called in.     At the time of my evaluation, patient reported that she had the chest pain even when she was discharged from outside hospital yesterday.  However, the chest pain got worse when she woke up this morning.  Patient was describing her chest pain to be sharp, 10/10 in severity and midsternal with some radiation towards the left upper extremity.  Stated that the morphine in the hospital was helping the pain but denied any relief of her chest pain with pain medications at home and that is why she came into the hospital.  Patient also reported associated mild lightheadedness.  No report of any shortness of breath at the time of my evaluation.    On further questions, patient denied any nausea/vomiting or any abdominal pain or any dysuria or diarrhea.  No report of any acute change in power/sensation either.  No report of any fevers or chills.  No report of any cough.    Patient was tearful and very anxious at the time of rapid response.  Whenever patient was engaged in conversation and distracted, patient did not complain of chest pain as much.    When asked, patient reported compliance with all of her medications including aspirin and prasugrel.  Patient has just been started on metoprolol as well and reported that she took the last dose of metoprolol 25 mg in the morning today.     Objective:                          Vital Signs: Last Filed                 Vital Signs: 24 Hour Range   BP: 112/76 (05/29 1900)  Temp: 36.5 ?C (97.7 ?F) (05/29 1800)  Pulse: 87 (05/29 1900)  Respirations: 13 PER MINUTE (05/29 1900)  SpO2: 95 % (05/29 1900)  O2 Device: None (Room air) (05/29 1710)  SpO2 Pulse: 103 (05/29 1738)  Height: 167.6 cm (5' 6) (05/29 1116) BP: (74-123)/(44-89)   Temp:  [36.3 ?C (97.4 ?F)-36.9 ?C (98.5 ?F)]   Pulse:  [67-150]   Respirations:  [10 PER MINUTE-30 PER MINUTE]   SpO2:  [93 %-100 %]   O2 Device: None (Room air)   Intensity Pain Scale (Self Report): 10 (02/13/22 1800) Vitals:    02/13/22 1116   Weight: 86.6 kg (191 lb)         Physical Exam    ? GENERAL APPEARANCE:?Alert and Oriented x 4. Appeared to be in moderate?acute distress. Obese  ? HEAD:?Normocephalic, Atraumatic  ? EYES:?PERRL, EOMI.   ? NECK:?Neck supple, non-tender, No JVD.  ? CARDIAC:?Tachycardiac.?Regular Rhythm. No Murmurs. There is no pedal edema. Extremities are warm.   ? LUNGS:?Clear to auscultation. No rales, rhonchi or wheezing.  ? ABDOMEN:?Soft, slightly?distended, nontender. No guarding or rebound.   ? EXTREMITIES:?No significant deformity or obvious joint abnormality.   ?  NEUROLOGICAL:?CN III-XII grossly intact. Sensation to light touch diminished in the right leg and foot (this is baseline per patient). Otherwise, strength and sensation grossly symmetric and intact throughout.   ? PSYCHIATRIC:?Patient was very anxious and tearful but otherwise?demonstrated?good judgement and reason.?      Lab Review  Pertinent labs reviewed    Radiology and other Diagnostics Review:  Pertinent radiology reviewed.    Assessment:    1. SIRS:  ? Patient was intermittently hypotensive with systolic blood pressure in 70s at the time of rapid response and with MAP less than 65 on many occasions.  ? Patient was status post approximately 1 L of fluids in the ER.  At the time of rapid response, patient was given 500 mL of IV fluids with NICOM and patient stroke-volume index change was only 6.2%.  Cardiology fellow performed bedside echo and reportedly patient IVC was collapsible.  Therefore, patient was given additional 500 of IV fluids.  By the end of rapid response, patient had approximately 2 L of IV fluid.  ? Patient noted to have a WBC count of 20 K.    ? Lactic acid was normal  ? No focal consolidation on CTA chest.  ? No report of any dysuria  ? DDx: Unclear source    2. Chest pain:  ? Pertinent prior history: Coronary artery disease?s/p?PCI to LAD on 02/11/2022 at [facility]  ? Patient echocardiogram on 02/11/2022 at outside hospital demonstrated LVEF of 45% without any significant valvular abnormalities.  No pericardial effusion was seen either.  ? This admission: Description of pain is sharp, midsternal and with radiation towards the left arm.  ? CTA chest is negative for dissection.  ? Bedside echo negative for any pericardial effusion.  ? High-sensitivity troponin assay: 40 --> 36 --> 36  ? ECG demonstrated left bundle branch block pattern but no other significant/acute ST or T wave changes.  ? DDx: Pericarditis > Type 1 NSTEMI    Plan:  ? Blood cultures x2 were ordered in the ER.  ? Ordered UA given SIRS with unclear source.   ? Started patient empirically on IV vancomycin and cefepime.   ? PRN IV Toradol ordered for chest pain.  Resumed patient's PTA oxycodone 10 mg but increased frequency to every 4 hours for better pain management.  Also resumed patient's PTA Flexeril  ? Resumed patient's PTA PRN Xanax 0.5 mg twice daily  ? Continued patient's PTA DAPT.   ? Given chest pain with elevated troponins and recent PCI, cardiology team has been formally consulted.  Echo has been ordered.  ? Holding PTA metoprolol given low blood pressure.  ? As patient blood pressure improved initially, patient was transferred to the floor.  However, reportedly patient blood pressure dropped again on the floor and subsequently patient was accepted to MICU team.     Appreciate rapid response team, cardiology team and MICU team assistance in the care of Rhonda Prince.  Will defer further management to MICU team.      I spent 60 minutes providing and personally directing critical care services including:   - History taking  - Systems review and physical examination  - Review of hemodynamic, respiratory, telemetry, laboratory, and imaging data  - Review of medications  - Management of fluids/electrolytes, sepsis protocol, antibiotics  - Organization and coordination of care plan with nursing staff and other teams (cardiology)  - Directing the formulation of the overall plan of care outlined above      [facility]  Almyra Deforest, MD Jerrel Ivory

## 2022-02-13 NOTE — Progress Notes
Rapid Response Team Progress Note    Date: 02/13/2022 Time: 5:56 PM  Patient: Rhonda Prince  Attending: Kelby Fam, MD;Shaf* Service: Med Private Swing 4 [phone]  Admission Date: 02/13/2022  LOS: 0 days    A Code/Rapid Response Timeline Event Report has been created for this patient on 5/29 at 1626    Rapid response called for hypotension. Upon arrival SBP in 80's all other vitals stable. NICOM was performed and showed pt. not fluid responsive. Labs ordered per MD. Cardiogenic shock team activated and cards fellow to bedside. Per cards fellow give another fluid at this time per POCUS assessment. Fluid given and pt. still hypotensive. Cards fellow stated at this time pt. not in cardiogenic shock. Pulm fellow contacted and to bedside to assess pt. Pulm fellow accepted pt. to ICU. Pt. transported to ICU with rapid team and handoff performed with ICU RNs.   The Attending physician, MD [facility], was notified by primary RN, of this event.    Scot Jun, RN

## 2022-02-13 NOTE — Progress Notes
NICOM Fluid Bolus or Passive Leg Raise Challenge:   Fluid Bolus Challenge: Yes, Passive Leg Raise: No  Fluid Volume infused: Time started: 1637   CI SVI   Baseline 3.4 37   Challenge 2.9 37   Stroke Volume Index Change: 6.2  Was patient fluid responsive? No

## 2022-02-13 NOTE — ED Notes
Jae Dire, APRN notified of pt's continued low BP's most recently 74/57. No new orders at this time. Pt remains on bolus of LR via IV pump.

## 2022-02-13 NOTE — ED Notes
Went into draw [facility] and draw blood for lactate. Pt is noted to be less tachy than pior. Asking when the Dr. Stann Mainland be in and when she will get pain medication. Pt appears more comfortable now than when she first arrived to ED room. Noted in chart review pt has behavioral hx such as cursing at staff members when she doesn't get what she wants. Pt is calm and cooperative at this time. Educated on ED process. Pt verbalizes understanding.

## 2022-02-13 NOTE — ED Notes
RAPID RESPONSE activated at this time d/t continuing to drop. Now 78/57.

## 2022-02-13 NOTE — ED Notes
Pt called out reporting 10/10 CP. Notified Jae Dire, APRN. Awaiting further orders.

## 2022-02-13 NOTE — ED Notes
Pt reporting her pain is coming back to a 10/10. Pt noted to be HYPOtensive w/ SBP's in the 70s. Informed Jae Dire, APRN of this finding. VO for of LR. Will also obtain repeat EKG at this time.

## 2022-02-13 NOTE — ED Provider Notes
Rhonda Prince is a 36 y.o. female.    Chief Complaint:  Chief Complaint   Patient presents with   ? Chest Pain     Crushing chest pain since Saturday, stents placed yesterday at [facility]. 10/10 pain       History of Present Illness:  Patient presents to the emergency department for chest pain that has been present for several days on and off and got worse over the weekend.  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  They placed 2 cardiac stents during this procedure.  She was discharged yesterday.  She reports today when she woke up the chest pain was much worse, rating it a 10 out of 10 and describes it as crushing on her chest.  She states that she was discharged with pain medication that is not controlling her pain.  She reports some mild shortness of breath and feeling somewhat lightheaded.  She reports some nausea but no vomiting.  Patient has been compliant with her medication and took 324 of aspirin prior to arrival today.          Review of Systems:  Review of Systems   Constitutional: Negative.    HENT: Negative.    Eyes: Negative.    Respiratory: Positive for shortness of breath.    Cardiovascular: Positive for chest pain. Negative for palpitations and leg swelling.   Gastrointestinal: Negative.    Endocrine: Negative.    Genitourinary: Negative.    Musculoskeletal: Negative.    Skin: Negative.    Neurological: Positive for light-headedness.       Allergies:  Pcn [penicillins]    Past Medical History:  Medical History:   Diagnosis Date   ? Chronic back pain    ? Coronary artery disease    ? Ovarian cyst        Past Surgical History:  Surgical History:   Procedure Laterality Date   ? HX HEART CATHETERIZATION  02/11/2022    Left heart catheterization with PCI to LAD   ? HX APPENDECTOMY     ? HX HYSTERECTOMY         Medical History:   Diagnosis Date   ? Chronic back pain    ? Coronary artery disease    ? Ovarian cyst      Surgical History:   Procedure Laterality Date   ? HX HEART CATHETERIZATION  02/11/2022    Left heart catheterization with PCI to LAD   ? HX APPENDECTOMY     ? HX HYSTERECTOMY         Social History:  Social History     Tobacco Use   ? Smoking status: Every Day     Packs/day: 1.50     Years: 11.00     Pack years: 16.50     Types: Cigarettes   ? Smokeless tobacco: Never   Vaping Use   ? Vaping Use: Never used   Substance Use Topics   ? Alcohol use: Not Currently   ? Drug use: No     Social History     Substance and Sexual Activity   Drug Use No             Family History:  No family history on file.    Vitals:  ED Vitals    Date and Time T BP P RR SPO2P SPO2 User   02/13/22 1800  36.5 ?C (97.7 ?F) 99/62 83 18 PER MINUTE -- 98 % [facility]   02/13/22 1750 -- 95/75 99 -- -- 97 % TC   02/13/22 1738 -- 95/75 113 30 PER MINUTE 103 97 % RH   02/13/22 1725 -- 88/66 94 13 PER MINUTE 95 97 % [facility]   02/13/22 1720 -- 84/59 -- -- -- -- TC   02/13/22 1720 -- 84/59 96 14 PER MINUTE 96 97 % SH   02/13/22 1716 -- 105/65 88 11 PER MINUTE -- 97 % TC   02/13/22 1715 -- 105/65 93 13 PER MINUTE 87 -- SH   02/13/22 1710 -- 101/73 103 13 PER MINUTE -- 97 % TC   02/13/22 1710 -- 101/73 99 18 PER MINUTE 73 -- SH   02/13/22 1705 -- 90/63 83 15 PER MINUTE -- 98 % TC   02/13/22 1705 -- 90/63 86 21 PER MINUTE 87 97 % SH   02/13/22 1700 -- 82/64 84 12 PER MINUTE -- 98 % TC   02/13/22 1655 -- 90/60 86 22 PER MINUTE -- 95 % TC   02/13/22 1650 -- 90/59 -- -- -- -- TC   02/13/22 1648 -- 85/63 101 13 PER MINUTE -- 98 % TC   02/13/22 1645 -- 85/63 90 21 PER MINUTE 90 99 % SH   02/13/22 1640 -- 97/65 77 14 PER MINUTE 77 98 % SH   02/13/22 1637 36.9 ?C (98.5 ?F) -- -- -- -- -- CL   02/13/22 1637 -- 98/85 94 11 PER MINUTE -- 98 % TC   02/13/22 1631 -- 86/61 119 14 PER MINUTE 118 98 % RH   02/13/22 1625 -- 78/57 98 17 PER MINUTE 100 98 % RH   02/13/22 1621 -- 88/51 67 11 PER MINUTE 63 96 % RH   02/13/22 1616 -- 74/57 90 17 PER MINUTE 99 97 % RH   02/13/22 1610 -- 87/50 85 14 PER MINUTE 86 97 % RH   02/13/22 1605 -- 82/51 82 18 PER MINUTE 86 94 % RH   02/13/22 1600 -- 88/69 98 12 PER MINUTE 99 96 % RH   02/13/22 1559 -- 88/47 94 19 PER MINUTE 93 96 % RH   02/13/22 1540 -- 76/66 110 28 PER MINUTE 107 97 % RH   02/13/22 1538 -- 75/44 107 20 PER MINUTE 111 -- RH   02/13/22 1532 -- -- 103 12 PER MINUTE 92 96 % RH   02/13/22 1507 -- -- 102 17 PER MINUTE -- -- RH   02/13/22 1447 -- 81/69 95 14 PER MINUTE 91 -- RH   02/13/22 1441 -- -- -- -- -- 100 % RH   02/13/22 1432 -- 104/64 96 21 PER MINUTE 97 97 % RH   02/13/22 1347 -- 106/86 95 -- 95 -- RH   02/13/22 1335 -- 92/69 106 19 PER MINUTE 106 94 % RH   02/13/22 1330 -- 102/79 89 13 PER MINUTE 99 93 % RH   02/13/22 1325 -- 102/75 98 12 PER MINUTE 96 96 % RH   02/13/22 1320 -- 100/78 97 10 PER MINUTE 96 95 % RH   02/13/22 1318 -- -- -- -- 108 95 % RH   02/13/22 1315 -- 95/70 106 15 PER MINUTE -- -- RH   02/13/22 1313 -- 93/71 107 17 PER MINUTE -- -- RH   02/13/22 1312 -- -- -- -- -- 93 % RH   02/13/22 1310 -- 82/65 105 11 PER  MINUTE 106 -- RH   02/13/22 1256 -- 93/69 100 12 PER MINUTE 99 94 % RH   02/13/22 1252 -- -- 95 14 PER MINUTE 98 95 % RH   02/13/22 1231 -- -- 110 16 PER MINUTE 111 94 % RH   02/13/22 1200 -- -- 115 -- 114 95 % RH   02/13/22 1159 -- -- -- 12 PER MINUTE -- -- RH   02/13/22 1151 -- 97/63 101 11 PER MINUTE 100 95 % RH   02/13/22 1140 -- 93/77 96 10 PER MINUTE 101 94 % RH   02/13/22 1130 -- 96/74 118 -- 121 97 % RH   02/13/22 1127 -- 103/89 121 29 PER MINUTE 124 98 % RH   02/13/22 1126 -- 109/80 125 15 PER MINUTE 125 98 % RH   02/13/22 1116 36.3 ?C (97.4 ?F) 123/73 150 24 PER MINUTE 150 99 % SM          Physical Exam:  Physical Exam  Constitutional:       Appearance: She is obese.   HENT:      Head: Normocephalic.      Nose: Nose normal.      Mouth/Throat:      Mouth: Mucous membranes are dry.   Eyes:      Conjunctiva/sclera: Conjunctivae normal. Pupils: Pupils are equal, round, and reactive to light.   Cardiovascular:      Rate and Rhythm: Normal rate and regular rhythm.      Pulses: Normal pulses.      Heart sounds: Normal heart sounds. No murmur heard.     No friction rub. No gallop.   Pulmonary:      Effort: Pulmonary effort is normal.      Breath sounds: Normal breath sounds.   Abdominal:      Palpations: Abdomen is soft.      Tenderness: There is no abdominal tenderness.   Musculoskeletal:         General: Normal range of motion.      Cervical back: Normal range of motion.   Skin:     General: Skin is warm and dry.      Capillary Refill: Capillary refill takes less than 2 seconds.   Neurological:      General: No focal deficit present.      Mental Status: She is alert and oriented to person, place, and time.   Psychiatric:         Mood and Affect: Mood normal.         Behavior: Behavior normal.         Laboratory Results:  Labs Reviewed   CBC AND DIFF - Abnormal       Result Value Ref Range Status    White Blood Cells 20.2 (*) 4.5 - 11.0 K/UL Final    RBC 4.93  4.0 - 5.0 M/UL Final    Hemoglobin 13.0  12.0 - 15.0 GM/DL Final    Hematocrit 03.4  36 - 45 % Final    MCV 78.9 (*) 80 - 100 FL Final    MCH 26.4  26 - 34 PG Final    MCHC 33.5  32.0 - 36.0 G/DL Final    RDW 74.2 (*) 11 - 15 % Final    Platelet Count 488 (*) 150 - 400 K/UL Final    MPV 8.1  7 - 11 FL Final    Neutrophils 75  41 - 77 % Final    Lymphocytes 17 (*)  24 - 44 % Final    Monocytes 6  4 - 12 % Final    Eosinophils 1  0 - 5 % Final    Basophils 1  0 - 2 % Final    Absolute Neutrophil Count 15.22 (*) 1.8 - 7.0 K/UL Final    Absolute Lymph Count 3.34  1.0 - 4.8 K/UL Final    Absolute Monocyte Count 1.23 (*) 0 - 0.80 K/UL Final    Absolute Eosinophil Count 0.20  0 - 0.45 K/UL Final    Absolute Basophil Count 0.18  0 - 0.20 K/UL Final   COMPREHENSIVE METABOLIC PANEL - Abnormal    Sodium 138  137 - 147 MMOL/L Final    Potassium 3.2 (*) 3.5 - 5.1 MMOL/L Final    Chloride 100  98 - 110 MMOL/L Final    Glucose 111 (*) 70 - 100 MG/DL Final    Blood Urea Nitrogen 4 (*) 7 - 25 MG/DL Final    Creatinine 1.61  0.4 - 1.00 MG/DL Final    Calcium 9.3  8.5 - 10.6 MG/DL Final    Total Protein 7.3  6.0 - 8.0 G/DL Final    Total Bilirubin 0.4  0.3 - 1.2 MG/DL Final    Albumin 4.2  3.5 - 5.0 G/DL Final    Alk Phosphatase 118 (*) 25 - 110 U/L Final    AST (SGOT) 17  7 - 40 U/L Final    CO2 27  21 - 30 MMOL/L Final    ALT (SGPT) 15  7 - 56 U/L Final    Anion Gap 11  3 - 12 Final    eGFR >60  >60 mL/min Final   PTT (APTT) - Abnormal    APTT 38.6 (*) 24.0 - 36.5 SEC Final   HIGH SENSITIVITY TROPONIN I 0 HOUR - Abnormal    hs Troponin I 0 Hour 40 (*) <12 ng/L Final   HIGH SENSITIVITY TROPONIN I 2 HOUR - Abnormal    hs Troponin I 2 Hour 36 (*) <12 ng/L Final   HIGH SENSITIVITY TROPONIN I 4 HR - Abnormal    hs Troponin I 4 Hour 36 (*) <12 ng/L Final   POC TROPONIN - Abnormal    Troponin-I-POC 0.06 (*) 0.00 - 0.05 NG/ML Final   CULTURE-BLOOD W/SENSITIVITY   CULTURE-BLOOD W/SENSITIVITY   PROTIME INR (PT)    Protime 12.1  9.5 - 14.2 SEC Final    INR 1.1  0.8 - 1.2 Final   BNP POC ER    BNP POC 54.0  0 - 100 PG/ML Final   POC LACTATE    LACTIC ACID POC 1.8  0.5 - 2.0 MMOL/L Final   POC LACTATE    LACTIC ACID POC 1.1  0.5 - 2.0 MMOL/L Final   BLUE TOP TUBE   MINT TOP TUBE   LAVENDER TOP TUBE   EXTRA URINE GRAY TOP   URINE CLEAR TOP TUBE   URINE TIGER TOP TUBE   URINALYSIS DIPSTICK REFLEX TO CULTURE   URINALYSIS MICROSCOPIC REFLEX TO CULTURE   UA GREY TOP TUBE   CLEAR TOP EXTRA URINE TUBE          Radiology Interpretation:    CTA CHEST WO/W CONT   Final Result      1.  Thoracic aorta is normal caliber. There is mild motion artifact at the aortic root on this nongated study however there is no evidence of acute aortic dissection.   2.  Coronary  stent in the LAD distribution indicates atherosclerosis, advanced for age and sex. This study is not optimized for evaluation of stent patency or the coronary arteries.   3.  Scattered linear atelectasis without focal consolidation.      By my electronic signature, I attest that I have personally reviewed the images for this examination and formulated the interpretations and opinions expressed in this report          Finalized by Micki Riley, MD on 02/13/2022 2:53 PM. Dictated by Rene Paci, M.D. on 02/13/2022 2:28 PM.         CHEST SINGLE VIEW   Final Result      1. Similar left basilar predominant linear opacities probably atelectasis or scarring. No definite new consolidation.   2. Stable cardiac contours with coronary stenting.      By my electronic signature, I attest that I have personally reviewed the images for this examination and formulated the interpretations and opinions expressed in this report          Finalized by Micki Riley, MD on 02/13/2022 11:50 AM. Dictated by Darden Dates, MD on 02/13/2022 11:42 AM.         POC ED [place] CARDIAC LIMITED    (Results Pending)   2D + DOPPLER ECHO    (Results Pending)         EKG:  ECG Results          ECG 12-LEAD (In process)      Collection Time Result Time VT RATE P-R Interval QRS DURATION Q-T Interval QTC Calc Bazett    02/13/22 15:48:30 02/13/22 15:48:51 83 158 134 428 502         Collection Time Result Time P Axis R Axis T Axis    02/13/22 15:48:30 02/13/22 15:48:51 61 3 84               In process                 Impression:    Normal sinus rhythm  Left bundle branch block  Abnormal ECG  When compared with ECG of 13-Feb-2022 11:15,  Vent. rate has decreased BY  42 BPM  T wave inversion no longer evident in Inferior leads  Nonspecific T wave abnormality has replaced inverted T waves in Lateral leads                             [facility] ED MAIN ECG TRIAGE ONLY (Final result)      Collection Time Result Time VT RATE P-R Interval QRS DURATION Q-T Interval QTC Calc Bazett    02/13/22 11:15:16 02/13/22 12:52:42 125 158 132 366 528         Collection Time Result Time P Axis R Axis T Axis    02/13/22 11:15:16 02/13/22 12:52:42 73 49 -61 Final result                 Impression:    Sinus tachycardia  Left bundle branch block  When compared with ECG of 29-Jul-2021 03:39,  Left bundle branch block is now present  Confirmed by Cherre Robins ([id number]) on 02/13/2022 12:52:37 PM                              Medical Decision Making:  Saidie Layer is a 36 y.o. female who presents with chief complaint as listed above. Based  on the history and presentation, the list of differential diagnoses considered included, but was not limited to, CAD, ACS, stent occlusion, coronary artery dissection, pericardial effusion, pericarditis, postoperative infection, postoperative bleeding.    ED Course    ED Course as of 02/13/22 1804   Mon Feb 13, 2022   1632 hs Troponin I 2 Hour(!): 36 [HR]   1649 Patient's blood pressure consistently soft.  Troponin remains elevated though stable.  Notified cardiogenic shock team to come for emergent analysis.  [place] NICOM being performed at bedside.  Patient does not appear fluid responsive. [KW]      ED Course User Index  [HR] Ring, Hope Gretta Cool, MD  [KW] Hinda Kehr, APRN-NP     Notes from patient's recent stay at [facility] were reviewed including imaging and lab results.  Her chest pain today is very concerning given her recent intervention and stent placement.  Her initial troponin returned slightly elevated at 40 and then gradually decreased to 36, which is fairly stable.  Her EKG showed a bundle branch block but nothing acutely ischemic.  She is having severe chest pain after a cardiac procedure and cardiology was consulted.  They did not necessarily feel that this represented a complication or issue related to the heart and requested further testing be performed before they are involved.  CTA was performed to rule out coronary artery or aortic dissection which was cleared.  Patient had just had a CTA a couple days ago that did not show any pulmonary emboli.  Patient persisted in having severe chest pain and my collaborating physician came to the bedside to reevaluate patient while she was remaining hypotensive.  Throughout her stay in the emergency department today patient's blood pressure did remain soft.  She activated a cardiogenic shock call and cardiology came to the bedside.  Cardiology and hospitalist were at the bedside who both agreed that despite the hypotension and lack of fluid responsiveness on the NICOM the patient would be okay to go to the floor.  Will defer to hospitalist for patient placement although am concerned that she may remain hypotensive as she is not fluid responsive.  This may be simply a sequela of her new metoprolol which was just added on yesterday after her cath and stent placement.  Patient reports she typically has normal to low blood pressures so this may have just been too much antihypertensive and made her hypotensive.  However, in the setting of her recent chest pain and stent placement have concerns for developing complication or pericarditis.  These concerns have been discussed with cardiology as well as hospitalist.    Complexity of Problems Addressed  Patient's active diagnoses as well as contributing pre-existing medical problems include:  Clinical Impression   Hypotension, unspecified hypotension type   Status post right and left heart catheterization     Evaluation performed for potential threat to life or bodily function during this visit given the initial differential diagnosis and clinical impression(s) as discussed previously in MDM/ED course.    Additional data reviewed:    ? History was obtained from an independent historian: Not in addition to what is mentioned above  ? Prior non-ED notes reviewed: Not in addition to what is mentioned above  ? Independent interpretation of diagnostic tests was performed by me: Not in addition to what is mentioned above  ? Patient presentation/management was discussed with the following qualified health care professionals and/or other relevant professionals: Not in addition to what is mentioned above  Risk evaluation:    ? Diagnosis or treatment of patient condition impacted by social determinant of health: None  ? Tests Considered but not performed due to clinical scoring (if not mentioned in ED course, aside from what is implied by clinical scores listed):   ? Rationale regarding whether admission or escalation of care considered if not performed (if not mentioned in ED course, aside from what is implied by clinical scores listed): pt admitted    ED Scoring:                                Facility Administered Meds:  Medications   melatonin tablet 5 mg (has no administration in time range)   enoxaparin (LOVENOX) syringe 40 mg (has no administration in time range)   acetaminophen (TYLENOL EXTRA STRENGTH) tablet 500 mg (has no administration in time range)   aspirin EC tablet 81 mg (has no administration in time range)   cyclobenzaprine (FLEXERIL) tablet 10 mg (has no administration in time range)   pantoprazole DR (PROTONIX) tablet 40 mg (has no administration in time range)   oxyCODONE tablet 10 mg (has no administration in time range)   prasugreL (EFFIENT) tablet 10 mg (has no administration in time range)   rosuvastatin (CRESTOR) tablet 10 mg (has no administration in time range)   venlafaxine XR (EFFEXOR XR) capsule 150 mg (has no administration in time range)   ketorolac (TORADOL) injection 15 mg (has no administration in time range)   ALPRAZolam (XANAX) tablet 0.5 mg (0.5 mg Oral Given 02/13/22 1731)   cefepime (MAXIPIME) 2 g in sodium chloride 0.9% (NS) 100 mL IVPB (MB+) ( Intravenous Continue to Inpatient 02/13/22 1749)   vancomycin (VANCOCIN) 1,750 mg in sodium chloride 0.9% (NS) 285 mL IVPB (has no administration in time range)   vancomycin, pharmacy to manage (has no administration in time range)   aspirin chewable tablet 243 mg (243 mg Oral Given 02/13/22 1250)   ondansetron (ZOFRAN) injection 4 mg (4 mg Intravenous Given 02/13/22 1250) morphine injection 4 mg (4 mg Intravenous Given 02/13/22 1250)   lactated ringers infusion (0 mL Intravenous Infusion Stopped 02/13/22 1358)   potassium chloride SR (K-DUR) tablet 40 mEq (40 mEq Oral Given 02/13/22 1315)   HYDROmorphone injection (DILAUDID) 0.5 mg (0.5 mg Intravenous Given 02/13/22 1430)   iohexoL (OMNIPAQUE-350) 350 mg/mL injection 70 mL (70 mL Intravenous Given 02/13/22 1414)   sodium chloride PF 0.9% injection 50 mL (50 mL Intravenous Given 02/13/22 1414)   lactated ringers infusion (0 mL Intravenous Infusion Stopped 02/13/22 1707)   ketorolac (TORADOL) injection 15 mg (15 mg Intravenous Given 02/13/22 1636)   sodium chloride 0.9 %   infusion (0 mL Intravenous Infusion Stopped 02/13/22 1707)   sodium chloride 0.9 %   infusion ( Intravenous Continue to Inpatient 02/13/22 1749)       Clinical Impression:  Clinical Impression   Hypotension, unspecified hypotension type   Status post right and left heart catheterization       Disposition/Follow up  ED Disposition     ED Disposition   Admit           No follow-up provider specified.    Medications:  Current Discharge Medication List          Procedure Notes:  Procedures       Attestation / Supervision:  I personally performed the E/M including history, physical exam, and MDM.  My collaborating  physicians were Dr. Christain Sacramento and Dr. Jettie Booze, who both personally evaluated patient at bedside as well.        Hinda Kehr, APRN-NP

## 2022-02-13 NOTE — Progress Notes
Pharmacy Vancomycin Note  Subjective:   Fraidy Mccarrick is a 36 y.o. female being treated for bloodstream infection.    Assessment:   Target levels for this patient:  1.  AUC (mcg*h/mL):  400-600  2.       Evaluation of AUC and/or level(s):      Plan:   1. Vanc 1750mg  x1 ordered in ED. Continue with 1250mg  every 12 hours.  2. Next scheduled level(s): To be determined by day pharmacist.  3. Pharmacy will continue to monitor and adjust therapy as needed.      Objective:   Calculations:  Calculated True Peak (mcg/mL):    Calculated Trough (mcg/mL):     Rate of elimination (h-1):     Half Life (hr):     Volume of distribution (L/kg):    AUC (mcg*h/mL):      Drug Levels:  No results found for: VANCOMYCIN 2HR POST DOSE, VANCOMYCIN TROUGH, VANCOMYCIN RANDOM    Current Vancomycin Orders   Medication Dose Route Frequency    vancomycin (VANCOCIN) 1,750 mg in sodium chloride 0.9% (NS) 285 mL IVPB  20 mg/kg Intravenous ONCE    [START ON 02/14/2022] vancomycin (VANCOCIN) in 0.9% sodium chloride IVPB 1,250 mg  15 mg/kg Intravenous Q12H*    vancomycin, pharmacy to manage  1 each Service Per Pharmacy       Recent Vancomycin Dosing and Administration:  Recent Vancomycin Admin      Ordered but not administered                Start Date of  vancomycin therapy: 02/13/2022  Additional Abx: cefepime  Cultures:  ,  ,  ,    White Blood Cells   Date/Time Value Ref Range Status   02/13/2022 1130 20.2 (H) 4.5 - 11.0 K/UL Final     Creatinine   Date/Time Value Ref Range Status   02/13/2022 1130 0.70 0.4 - 1.00 MG/DL Final     Blood Urea Nitrogen   Date/Time Value Ref Range Status   02/13/2022 1130 4 (L) 7 - 25 MG/DL Final     Estimated CrCl:      Intake/Output Summary (Last 24 hours) at 02/13/2022 1806  Last data filed at 02/13/2022 1800  Gross per 24 hour   Intake 600 ml   Output --   Net 600 ml      UOP:    Actual Weight:  86.6 kg (191 lb)  Dosing BW:  86.6 kg     [address], 02/15/2022  02/13/2022

## 2022-02-13 NOTE — Progress Notes
Cardiogenic Shock Activation Progress Note    Responded to page for activation on Rhonda Prince.     She is a 36 y/o female with a history of anxiety, obesity who presented to [facility] with unrelenting chest pain today. Of note, she had presented to [facility] with the same chest pain 2 days ago, which did not relieve with nitro, was found to have a LBBB on EKG and an EF of 45% , hence was taken to cath lab as unstable angina, where she was found to have a 75% pLAD stenosis, which was stented. She was discharged on aspirin and prasugrel, and advised to come to [facility] if she has further chest pain.  Today, in the ED, her EKG showed sinus tachycardia with LBBB ( did not meet Sgarbossa criteria), hs-troponin was 40, which down trended to to 36 and due to continued chest pain, a CTA was done, which ruled out acute aortic dissection.   After that, for a systolic blood pressure in the 70s, a cardiogenic shock activation was called.   Evaluated patient at bedside, warm to touch, perfusing well, MAPs in the 70s. Complained of chest pain, was easily able to be distracted with conversation, and got comfortable. Lactic acid was 1.1. Repeat POC trop was 0.06. Performed bedside ECHO with extremely poor windows, but EF likely in the 40-45% range, apex is more hyperdynamic than the base, RV function good, no pericardial effusion, IVC 1cm and completely collapsed on inspiration. Repeat EKG showed NSR with LBBB, no acute change from prior.     Assessment:   -Chest pain that the patient is experiencing is not cardiac in nature.   -She is warm to touch, perfusing well, lactate negative, MAP>65. She is not in cardiogenic shock. Please explore other etiologies for hypotension if persists.   -Recommend continuing DAPT, BB, Statin. Formal Echo tomorrow.     Discussed with Dr. Bing Matter.     Misty Stanley  Cardiovascular diseases fellow  [phone]

## 2022-02-14 ENCOUNTER — Encounter: Admit: 2022-02-14 | Discharge: 2022-02-14 | Payer: MEDICAID

## 2022-02-14 ENCOUNTER — Observation Stay: Admit: 2022-02-14 | Discharge: 2022-02-14 | Payer: MEDICAID

## 2022-02-14 MED ADMIN — OXYCODONE 10 MG PO TAB [166908]: 10 mg | ORAL | @ 15:00:00 | Stop: 2022-02-14 | NDC 68084096811

## 2022-02-14 MED ADMIN — COSYNTROPIN 0.25 MG IJ SOLR [78840]: 0.25 mg | INTRAVENOUS | @ 14:00:00 | Stop: 2022-02-14 | NDC 00781344071

## 2022-02-14 MED ADMIN — MORPHINE 15 MG PO TAB [5178]: 15 mg | ORAL | @ 20:00:00 | NDC 00054023524

## 2022-02-14 MED ADMIN — COLCHICINE 0.6 MG PO TAB [1821]: 0.6 mg | ORAL | @ 20:00:00 | NDC 70010000201

## 2022-02-14 MED ADMIN — POTASSIUM CHLORIDE 20 MEQ PO TBTQ [35943]: 30 meq | ORAL | @ 16:00:00 | Stop: 2022-02-14 | NDC 00832532511

## 2022-02-14 MED ADMIN — CYCLOBENZAPRINE 10 MG PO TAB [2017]: 10 mg | ORAL | @ 20:00:00 | NDC 72888001400

## 2022-02-14 MED ADMIN — OXYCODONE 10 MG PO TAB [166908]: 10 mg | ORAL | @ 16:00:00 | Stop: 2022-02-14 | NDC 68084096811

## 2022-02-14 MED ADMIN — IOHEXOL 350 MG IODINE/ML IV SOLN [81210]: 100 mL | INTRAVENOUS | @ 13:00:00 | Stop: 2022-02-14 | NDC 00407141491

## 2022-02-14 MED ADMIN — HYDROCORTISONE 20 MG PO TAB [3734]: 100 mg | ORAL | @ 20:00:00 | Stop: 2022-02-14 | NDC 00115170001

## 2022-02-14 MED ADMIN — OXYCODONE 10 MG PO TAB [166908]: 10 mg | ORAL | @ 18:00:00 | Stop: 2022-02-14 | NDC 68084096811

## 2022-02-14 MED ADMIN — METHOCARBAMOL 750 MG PO TAB [4972]: 750 mg | ORAL | @ 20:00:00 | NDC 70010077005

## 2022-02-14 MED ADMIN — FLUDROCORTISONE 0.1 MG PO TAB [10054]: 0.1 mg | ORAL | @ 16:00:00 | Stop: 2022-02-14 | NDC 68084028811

## 2022-02-14 MED ADMIN — SPIRONOLACTONE 25 MG PO TAB [7437]: 25 mg | ORAL | @ 20:00:00 | NDC 00904692761

## 2022-02-14 MED ADMIN — SODIUM CHLORIDE 0.9 % IJ SOLN [7319]: 50 mL | INTRAVENOUS | @ 13:00:00 | Stop: 2022-02-14 | NDC 00409488820

## 2022-02-14 MED ADMIN — LACTATED RINGERS IV SOLP [4318]: 500 mL | INTRAVENOUS | @ 07:00:00 | Stop: 2022-02-14 | NDC 00338011704

## 2022-02-14 MED ADMIN — CYCLOBENZAPRINE 10 MG PO TAB [2017]: 10 mg | ORAL | @ 16:00:00 | NDC 72888001400

## 2022-02-15 MED ADMIN — MORPHINE 15 MG PO TAB [5178]: 15 mg | ORAL | @ 22:00:00 | NDC 00054023524

## 2022-02-15 MED ADMIN — COLCHICINE 0.6 MG PO TAB [1821]: 0.6 mg | ORAL | @ 14:00:00 | Stop: 2022-02-15 | NDC 70010000201

## 2022-02-15 MED ADMIN — ALPRAZOLAM 0.5 MG PO TAB [325]: 0.5 mg | ORAL | @ 02:00:00 | NDC 65862067701

## 2022-02-15 MED ADMIN — GADOBENATE DIMEGLUMINE 529 MG/ML (0.1MMOL/0.2ML) IV SOLN [135881]: 32 mL | INTRAVENOUS | @ 01:00:00 | Stop: 2022-02-15 | NDC 00270516415

## 2022-02-15 MED ADMIN — MORPHINE 15 MG PO TAB [5178]: 15 mg | ORAL | @ 10:00:00 | NDC 00054023524

## 2022-02-15 MED ADMIN — MORPHINE 15 MG PO TAB [5178]: 15 mg | ORAL | @ 16:00:00 | NDC 00054023524

## 2022-02-15 MED ADMIN — MORPHINE 15 MG PO TAB [5178]: 15 mg | ORAL | @ 03:00:00 | Stop: 2022-02-15 | NDC 00054023524

## 2022-02-15 MED ADMIN — SPIRONOLACTONE 25 MG PO TAB [7437]: 25 mg | ORAL | @ 14:00:00 | NDC 00904692761

## 2022-02-15 MED ADMIN — CYCLOBENZAPRINE 10 MG PO TAB [2017]: 10 mg | ORAL | NDC 72888001400

## 2022-02-15 MED ADMIN — COLCHICINE 0.6 MG PO TAB [1821]: 0.6 mg | ORAL | @ 02:00:00 | NDC 70010000201

## 2022-02-15 MED ADMIN — LORAZEPAM 1 MG PO TAB [4573]: 1 mg | ORAL | Stop: 2022-02-15 | NDC 00904600861

## 2022-02-15 MED ADMIN — METHOCARBAMOL 750 MG PO TAB [4972]: 750 mg | ORAL | @ 11:00:00 | NDC 70010077005

## 2022-02-15 MED ADMIN — ALPRAZOLAM 0.5 MG PO TAB [325]: 0.5 mg | ORAL | @ 18:00:00 | NDC 65862067701

## 2022-02-15 MED ADMIN — METHOCARBAMOL 750 MG PO TAB [4972]: 750 mg | ORAL | @ 19:00:00 | NDC 70010077005

## 2022-02-15 MED ADMIN — ALPRAZOLAM 0.5 MG PO TAB [325]: 0.5 mg | ORAL | NDC 65862067701

## 2022-02-15 MED ADMIN — METOPROLOL SUCCINATE 25 MG PO TB24 [81866]: 25 mg | ORAL | @ 02:00:00 | NDC 00904632261

## 2022-02-15 MED ADMIN — LIDOCAINE 5 % TP PTMD [80759]: 1 | TOPICAL | @ 17:00:00 | NDC 00591352511

## 2022-02-15 MED ADMIN — MORPHINE 15 MG PO TAB [5178]: 15 mg | ORAL | NDC 00054023524

## 2022-02-15 MED ADMIN — METHOCARBAMOL 750 MG PO TAB [4972]: 750 mg | ORAL | @ 02:00:00 | NDC 70010077005

## 2022-02-16 ENCOUNTER — Encounter: Admit: 2022-02-16 | Discharge: 2022-02-16 | Payer: MEDICAID | Primary: Family

## 2022-02-16 MED ADMIN — METHOCARBAMOL 750 MG PO TAB [4972]: 750 mg | ORAL | @ 11:00:00 | Stop: 2022-02-16 | NDC 70010077005

## 2022-02-16 MED ADMIN — MORPHINE 15 MG PO TAB [5178]: 15 mg | ORAL | @ 17:00:00 | Stop: 2022-02-16 | NDC 00054023524

## 2022-02-16 MED ADMIN — MORPHINE 15 MG PO TAB [5178]: 15 mg | ORAL | @ 06:00:00 | Stop: 2022-02-16 | NDC 00054023524

## 2022-02-16 MED ADMIN — METHOCARBAMOL 750 MG PO TAB [4972]: 750 mg | ORAL | @ 02:00:00 | NDC 70010077005

## 2022-02-16 MED ADMIN — CYCLOBENZAPRINE 10 MG PO TAB [2017]: 10 mg | ORAL | @ 06:00:00 | Stop: 2022-02-16 | NDC 72888001400

## 2022-02-16 MED ADMIN — SPIRONOLACTONE 25 MG PO TAB [7437]: 25 mg | ORAL | @ 14:00:00 | Stop: 2022-02-16 | NDC 00904692761

## 2022-02-16 MED ADMIN — ALPRAZOLAM 0.5 MG PO TAB [325]: 0.5 mg | ORAL | @ 02:00:00 | NDC 59762372001

## 2022-02-16 MED ADMIN — ALPRAZOLAM 0.5 MG PO TAB [325]: 0.5 mg | ORAL | @ 16:00:00 | Stop: 2022-02-16 | NDC 59762372001

## 2022-02-16 MED ADMIN — MORPHINE 15 MG PO TAB [5178]: 15 mg | ORAL | @ 13:00:00 | Stop: 2022-02-16 | NDC 00054023524

## 2022-02-16 MED ADMIN — MAGNESIUM SULFATE IN D5W 1 GRAM/100 ML IV PGBK [166578]: 1 g | INTRAVENOUS | @ 03:00:00 | Stop: 2022-02-16 | NDC 00338170940

## 2022-02-16 MED ADMIN — METOPROLOL SUCCINATE 25 MG PO TB24 [81866]: 25 mg | ORAL | @ 01:00:00 | NDC 00904632261

## 2022-02-16 MED ADMIN — ALPRAZOLAM 0.5 MG PO TAB [325]: 0.5 mg | ORAL | @ 11:00:00 | Stop: 2022-02-16 | NDC 65862067701

## 2022-02-16 MED ADMIN — MORPHINE 15 MG PO TAB [5178]: 15 mg | ORAL | @ 09:00:00 | Stop: 2022-02-16 | NDC 00054023524

## 2022-02-16 MED ADMIN — COLCHICINE 0.6 MG PO TAB [1821]: 0.6 mg | ORAL | @ 06:00:00 | Stop: 2022-02-16 | NDC 70010000201

## 2022-02-16 MED ADMIN — MORPHINE 15 MG PO TAB [5178]: 15 mg | ORAL | @ 03:00:00 | NDC 00054023524

## 2022-02-16 MED ADMIN — MORPHINE 15 MG PO TAB [5178]: 7.5 mg | ORAL | @ 01:00:00 | Stop: 2022-02-16 | NDC 00054023524

## 2022-02-16 MED ADMIN — COLCHICINE 0.6 MG PO TAB [1821]: 0.6 mg | ORAL | @ 14:00:00 | Stop: 2022-02-16 | NDC 70010000201

## 2022-02-16 MED ADMIN — LIDOCAINE 5 % TP PTMD [80759]: 1 | TOPICAL | @ 14:00:00 | Stop: 2022-02-16 | NDC 00591352511

## 2022-02-16 MED FILL — COLCHICINE 0.6 MG PO TAB: 0.6 mg | ORAL | 14 days supply | Qty: 28 | Fill #1 | Status: CP

## 2022-02-16 MED FILL — SPIRONOLACTONE 25 MG PO TAB: 25 mg | ORAL | 30 days supply | Qty: 30 | Fill #1 | Status: CP

## 2022-02-17 ENCOUNTER — Encounter: Admit: 2022-02-17 | Discharge: 2022-02-17 | Payer: MEDICAID | Primary: Family

## 2022-02-17 NOTE — Telephone Encounter
Patient Discharge Date from hospital: 02/16/22  Date Call Attempted: 02/17/22  Number of Attempts: 1  Date Call Completed:  02/17/22         Two Patient Identifier complete: Yes [x]     Next Appointment    Next follow-up appointment on 02/22/22 at 2:00 with [facility], APRN-NP.     Transportation    Does pt have transportation?  Yes [x]     No []    NA []      Home Health    N/A    Medications    Does pt have all medications? Yes  [x]     No []         Diet     200 mg cholesterol, 2 G Na    Is patient following prescribed diet and restrictions?  Yes [x]    No []      Scale/Weight    Does pt have a scale at home?  Yes []    No [x]  Pt should be getting scale in the mail today.    Did pt weight first thing this morning?  Yes []    No []      If yes, what was pt's first morning weight today?      Signs and Symptoms    Pt reports the following symptoms: Pt reports continuing chest pain that brought her in to the hospital. She denies any other symptoms at this time.     Pt verbalized understanding of signs and symptoms of HF and when to contact a provider or seek immediate assistance at the ER.     Was pt given zone sheet? Yes [x]   No []     Intervention(s)    Pt educated on the importance of weighing daily first thing in the morning before dressing, before eating or drinking, and after voiding using the same scale in the same location and write results down in note pad or log. Notify us for weight gains of 3 lbs in one day or 5 lbs in one week. Notify your provider for increased SOA.  Notify your provider for swelling or increased swelling in BLE or abdominal fullness/bloating. Advised to check B/P at least once daily. Check 1-2 hours after am meds. Log results. Check B/P other times if feeling lightheaded, dizzy, or if you feel your heart rate is elevated. Document the time you checked and any symptoms you may be feeling at the time. Call 911 for sudden, severe chest/pain pressure/SOA develops. Be sure to keep your follow up appointment and bring your weight logs, B/P logs, and medication list with you to your appointment. Call us at [phone] if you have any questions.       Plan of Care    Continued education needed for heart failure symptom management and when to contact our office.

## 2022-02-20 ENCOUNTER — Encounter: Admit: 2022-02-20 | Discharge: 2022-02-20 | Payer: MEDICAID | Primary: Family

## 2022-03-06 ENCOUNTER — Encounter: Admit: 2022-03-06 | Discharge: 2022-03-06 | Payer: MEDICAID | Primary: Family

## 2022-03-13 ENCOUNTER — Encounter: Admit: 2022-03-13 | Discharge: 2022-03-13 | Payer: MEDICAID | Primary: Family

## 2022-03-13 ENCOUNTER — Emergency Department: Admit: 2022-03-13 | Discharge: 2022-03-13 | Payer: MEDICAID

## 2022-03-13 DIAGNOSIS — R072 Precordial pain: Secondary | ICD-10-CM

## 2022-03-13 LAB — CBC AND DIFF
ABSOLUTE BASO COUNT: 0.1 K/UL (ref 0–0.20)
ABSOLUTE EOS COUNT: 0.2 K/UL (ref 0–0.45)
ABSOLUTE MONO COUNT: 0.9 K/UL — ABNORMAL HIGH (ref 0–0.80)
ABSOLUTE NEUTROPHIL: 10 K/UL — ABNORMAL HIGH (ref 1.8–7.0)
MDW (MONOCYTE DISTRIBUTION WIDTH): 18 (ref <20.7)
RBC COUNT: 4.9 M/UL — ABNORMAL HIGH (ref 4.0–5.0)

## 2022-03-13 LAB — URINALYSIS MICROSCOPIC REFLEX TO CULTURE

## 2022-03-13 LAB — URINALYSIS DIPSTICK REFLEX TO CULTURE
GLUCOSE,UA: NEGATIVE g/dL (ref 0–5)
LEUKOCYTES: NEGATIVE mL/min (ref 1.0–4.8)
PROTEIN,UA: NEGATIVE mg/dL (ref 0.3–1.2)
URINE BLOOD: NEGATIVE MMOL/L — ABNORMAL LOW (ref 21–30)
URINE KETONE: NEGATIVE U/L — ABNORMAL HIGH (ref 25–110)

## 2022-03-13 LAB — COMPREHENSIVE METABOLIC PANEL
ALT: 10 U/L (ref >60)
BLD UREA NITROGEN: 5 mg/dL — ABNORMAL LOW (ref 7–25)
GLUCOSE,PANEL: 73 mg/dL — ABNORMAL LOW (ref 70–100)

## 2022-03-13 LAB — MAGNESIUM: MAGNESIUM: 2.1 mg/dL (ref 1.6–2.6)

## 2022-03-13 LAB — HIGH SENSITIVITY TROPONIN I 0 HOUR: HIGH SENSITIVITY TROPONIN I 0 HOUR: 3 ng/L — ABNORMAL HIGH (ref <12)

## 2022-03-13 LAB — D-DIMER: D-DIMER: 345 ng{FEU}/mL (ref <500)

## 2022-03-13 MED ORDER — FENTANYL CITRATE (PF) 50 MCG/ML IJ SOLN
50 ug | Freq: Once | INTRAVENOUS | 0 refills | Status: CP
Start: 2022-03-13 — End: ?
  Administered 2022-03-13: 21:00:00 50 ug via INTRAVENOUS

## 2022-03-13 MED ORDER — NITROGLYCERIN 0.4 MG SL SUBL
.4 mg | SUBLINGUAL | 0 refills | Status: DC | PRN
Start: 2022-03-13 — End: 2022-03-14
  Administered 2022-03-13 (×2): 0.4 mg via SUBLINGUAL

## 2022-03-13 MED ORDER — KETOROLAC 15 MG/ML IJ SOLN
15 mg | Freq: Once | INTRAVENOUS | 0 refills | Status: CP
Start: 2022-03-13 — End: ?
  Administered 2022-03-13: 22:00:00 15 mg via INTRAVENOUS

## 2022-03-13 MED ORDER — LIDOCAINE 5 % TP PTMD
1 | Freq: Once | TOPICAL | 0 refills | Status: DC
Start: 2022-03-13 — End: 2022-03-14
  Administered 2022-03-13: 22:00:00 1 via TOPICAL

## 2022-03-13 MED ORDER — FENTANYL CITRATE (PF) 50 MCG/ML IJ SOLN
50 ug | Freq: Once | INTRAVENOUS | 0 refills | Status: CP
Start: 2022-03-13 — End: ?
  Administered 2022-03-13: 23:00:00 50 ug via INTRAVENOUS

## 2022-03-13 MED ORDER — LORAZEPAM 2 MG/ML IJ SOLN
.5 mg | Freq: Once | INTRAVENOUS | 0 refills | Status: CP
Start: 2022-03-13 — End: ?
  Administered 2022-03-13: 22:00:00 0.5 mg via INTRAVENOUS

## 2022-03-13 MED ORDER — ASPIRIN 81 MG PO CHEW
324 mg | Freq: Every day | ORAL | 0 refills | Status: DC
Start: 2022-03-13 — End: 2022-03-14
  Administered 2022-03-13: 20:00:00 243 mg via ORAL

## 2022-03-13 MED ADMIN — NITROGLYCERIN 0.4 MG SL SUBL [5604]: 0.4 mg | SUBLINGUAL | @ 20:00:00 | Stop: 2022-03-13 | NDC 59762330403

## 2022-03-13 NOTE — ED Notes
Patient is a 36 yo female who came into the ED today with C/O chest pain and palpitations that has progressed since Thursday. Patient reports the palpitations began Thursday morning, say her PCP that day, and then began having chest pain that night. States the CP is a crushing pain in her mid chest that radiates to her left shoulder. Reports she had some cardiac stents placed a few weeks and has been having some intermittent CP since. Reports she went to another ER recently and had tachycardia, she states that the other ER just gave her her home dose of Xanax and discharged her. Patient spoke to her caseworker about the situation and she instructed her to come here. Patient is alert and oriented, breathing is non-labored, skin is appropriate to age and ethnicity. Patient is resting in bed, locked in lowest position, side rails up, call light in reach.    Patient assumes responsibility of all belongings.

## 2022-03-13 NOTE — ED Notes
Patient calling out asking to speak with head doctor. Dr. Andrey Campanile notified.

## 2022-03-13 NOTE — ED Notes
Patient educated on discharge instructions, home care, and follow up care with cardiology and the pain clinic. Patient verbalized understanding and had all questions answered at this time. Patient is alert and oriented and vital signs stable. Patient is accompanied out by visitor.     Patient left with all belongings.

## 2022-03-13 NOTE — Unmapped
Thank you for choosing [facility] and the Department of Emergency Medicine for your healthcare needs.    You were seen in the emergency department for chest pain.  Your lab work-up and imaging was very reassuring for no signs of heart attack or worsening cardiac cause of this pain.  Is most likely secondary to the muscles and nerves surrounding the chest.  We have referred you for quick follow-up with cardiology and they should call you within the next few days to schedule an appointment. Call [phone] to schedule an appointment if you do not hear from them tomorrow. You may also call   [phone] to schedule an appointment with our anesthesia pain clinic.  If your condition required a prescription medication, it has been e-prescribed (sent electronically by computer) to the preferred pharmacy that we have on file for you and you do not need a paper copy of the prescription.  Please pick up your medication from your pharmacy as soon as possible.  If your pharmacy does not yet accept electronic prescriptions, a paper copy of the prescription has been provided to you and you must take this to the pharmacy of your choice to have it filled.  Please review your after visit summary to ensure that we have the correct pharmacy on file for you.  Please let us know immediately if we need to update your information to reflect your current and preferred pharmacy.  Please contact us immediately if you encounter any problems related to the electronic prescribing process.  Please call or return to the emergency department if you are unable to obtain your prescription medication.  Any request for medication refills should be directed to your primary care physician.    Please follow up with the designated physician(s) as instructed.  If you do not have a primary care physician, you need to establish care with one.  Ask your physician to obtain your records and go over all results in detail.  Some of the results provided to you today may be preliminary results and significant changes will be provided to you as necessary.  However, there may be incidental findings unrelated to the reason(s) for today's visit that will require non-emergent follow up in the near future by you and your primary care physician.      If you received any narcotic pain medications or sedatives while in the emergency department, you should NOT drive and you should not drive or operate machinery for 24 hours or while on those medications.    If your blood pressure was over 130/90, you should see your doctor to get your blood pressure rechecked.  Your doctor may start medications to control your blood pressure.      If your blood sugar was elevated, you should see your doctor to get your blood sugar rechecked.  Your doctor may start medications to control your blood sugar.      If you have been diagnosed with heart failure, weigh yourself daily and notify your physician of a weight gain of more than 2 pounds in a day or 3-5 pounds in a week.    If you were diagnosed with seizures, you may NOT drive for the next 6 months -- this is state law.  You should not drive, operate any heavy machinery, bathe or shower while home alone or with the bathroom door locked, swim alone, climb up on a ladder, use firearms, or do anything that would put you or others in danger  should you have another seizure during that activity.  Please schedule an appointment for follow-up with both your primary care physician and with the department of neurology.    If you have a wound, we have done our best to clean and care for the injury.  There may be retained foreign bodies that could not be seen, found, or removed.  Watch for signs of infection (redness, warmth, swelling, discharge, fever) and return to the emergency department, or follow-up with your regular physician, if any of these occur.  Sutures on the face should be removed in 5-7 days, or as otherwise instructed.  Sutures and staples on other areas of the body should be removed in 10-14 days, or as otherwise instructed.  Do not put any petroleum-based antibiotic ointment on wound adhesive (glue) because it will cause the adhesive to break down and fail.  You may safely shower and cleanse your repaired wounds with soap and water after 24 hours, but do not soak wounds or get them wet for prolonged periods of time (no soaking, bathing, or swimming).    Alcohol and drugs are bad for your health.  If you are under the age of 37 you cannot drink alcohol legally.  If you are 61 years of age or older and you drink alcohol, you should not drink in excess, you should only drink responsibly, and you should never drink and drive.  If you live in a state that has legalized marijuana, please use responsibly.  Do NOT use illegal drugs such as cocaine, methamphetamine, heroin, or phencyclidine.  If you are addicted to any of these substances and are ready to quit there are resources to help you do so.  Please ask any health care provider for help.    Smoking, vaping or chewing tobacco is bad for your health.  Do not start using these products.  If you use any form of tobacco, it is highly recommended that you stop using these products.  You may need help to quit using tobacco products and you should ask any of your health care providers about resources available to assist in quitting.       You may return to the emergency department at any time and for any health care concern that you believe is in need of emergent, urgent, or timely evaluation.

## 2022-03-13 NOTE — ED Notes
ED Initial Provider Note:    This patient was seen in the ED triage area to initiate and expedite the patients ED care when possible.    ED Chief Complaint:   No chief complaint on file.      S: Rhonda Prince is a 36 y.o. female who presents to the Emergency Department for chest pain and tachycardia. She has been having the chest pain since Thursday or Friday. She states she checked her heart rate at home and it was 140. States pain is a crushing pain in her chest. Also reports having shortness of breath.    PMHx:  Medical History:   Diagnosis Date    Chronic back pain     Coronary artery disease     Ovarian cyst        LMP 03/05/2013    O: Brief Physical: RR even and non-labored. Speaking in full sentences, skin dry and warm. Irregular rhythm, tachycardia. Lungs CTAB.    A/P: The patient was seen by me as an initial provider in triage. A brief history and physical was obtained. My exam is intended to be an initial medial screening exam. Initial orders have been placed by me. My working diagnosis is ASC, chf, PE.    The patient is deemed appropriate for the main ED. The patient's care will be resumed by the ED provider care team once the patient is roomed in the ED. A more detailed / complete H&P will be documented by those providers.

## 2022-03-16 ENCOUNTER — Encounter: Admit: 2022-03-16 | Discharge: 2022-03-16 | Payer: MEDICAID | Primary: Family

## 2022-03-17 ENCOUNTER — Encounter: Admit: 2022-03-17 | Discharge: 2022-03-17 | Payer: MEDICAID | Primary: Family

## 2022-04-07 ENCOUNTER — Encounter: Admit: 2022-04-07 | Discharge: 2022-04-07 | Payer: MEDICAID | Primary: Family

## 2022-04-07 ENCOUNTER — Emergency Department: Admit: 2022-04-07 | Discharge: 2022-04-07 | Payer: MEDICAID

## 2022-04-07 LAB — COMPREHENSIVE METABOLIC PANEL
ALBUMIN: 3.3 g/dL — ABNORMAL LOW (ref 3.5–5.0)
ALK PHOSPHATASE: 126 U/L — ABNORMAL HIGH (ref 25–110)
ALT: 4 U/L — ABNORMAL LOW (ref 7–56)
ANION GAP: 13 K/UL — ABNORMAL HIGH (ref 3–12)
AST: 11 U/L (ref 7–40)
BLD UREA NITROGEN: 3 mg/dL — ABNORMAL LOW (ref 7–25)
CHLORIDE: 101 MMOL/L — ABNORMAL LOW (ref 98–110)
CO2: 25 MMOL/L (ref 21–30)
CREATININE: 0.6 mg/dL (ref 0.4–1.00)
EGFR: >60 mL/min (ref >60)
GLUCOSE,PANEL: 82 mg/dL — ABNORMAL LOW (ref 70–100)
SODIUM: 139 MMOL/L (ref 137–147)
TOTAL BILIRUBIN: 0.3 mg/dL (ref 0.3–1.2)
TOTAL PROTEIN: 6.5 g/dL — ABNORMAL HIGH (ref 6.0–8.0)

## 2022-04-07 LAB — CBC AND DIFF
ABSOLUTE BASO COUNT: 0.1 K/UL (ref 0–0.20)
ABSOLUTE EOS COUNT: 0.5 K/UL — ABNORMAL HIGH (ref 0–0.45)
ABSOLUTE MONO COUNT: 0.7 K/UL (ref 0–0.80)
WBC COUNT: 12 K/UL — ABNORMAL HIGH (ref 4.5–11.0)

## 2022-04-07 LAB — BETA-HCG: BETA-HCG: <1 U/L — ABNORMAL HIGH (ref <5)

## 2022-04-07 LAB — HIGH SENSITIVITY TROPONIN I 0 HOUR: HIGH SENSITIVITY TROPONIN I 0 HOUR: 4 ng/L — ABNORMAL LOW (ref <12)

## 2022-04-07 MED ORDER — FENTANYL CITRATE (PF) 50 MCG/ML IJ SOLN
50 ug | INTRAVENOUS | 0 refills | Status: AC | PRN
Start: 2022-04-07 — End: ?
  Administered 2022-04-08 (×2): 50 ug via INTRAVENOUS

## 2022-04-07 MED ORDER — KETOROLAC 15 MG/ML IJ SOLN
15 mg | Freq: Once | INTRAVENOUS | 0 refills | Status: CP
Start: 2022-04-07 — End: ?
  Administered 2022-04-08: 03:00:00 15 mg via INTRAVENOUS

## 2022-04-08 ENCOUNTER — Emergency Department: Admit: 2022-04-08 | Discharge: 2022-04-08 | Payer: MEDICAID

## 2022-04-08 ENCOUNTER — Emergency Department: Admit: 2022-04-08 | Discharge: 2022-04-07 | Payer: MEDICAID

## 2022-04-08 MED ADMIN — FENTANYL CITRATE (PF) 50 MCG/ML IJ SOLN [3037]: 50 ug | INTRAVENOUS | @ 08:00:00 | Stop: 2022-04-08 | NDC 00409909412

## 2022-04-08 MED ADMIN — POTASSIUM CHLORIDE 20 MEQ PO TBTQ [35943]: 60 meq | ORAL | @ 05:00:00 | Stop: 2022-04-08 | NDC 00832532511

## 2022-04-08 NOTE — ED Notes
Pt refusing to be stuck for PIV access stating "I don't want to be stuck anymore. It is my right and I have that right to refuse." Pt educated on the importance of IV access. Pt continuing to refuse to be stuck for IV access. Primary RN notified.

## 2022-04-08 NOTE — ED Notes
36 y.o. female presents to the ED w/ c/o CP. Pt reports recent stent placement in June '23. Pt reports waking up this morning ~0800 and mid-sternal CP starting shortly after. Pt reports constant sharp-pressure CP that radiates to left arm and left-side of neck. Pt reports taking x1 81mg  of ASA this morning and EMS giving her x3 81mg  ASA on her way to [facility] earlier today. Pt reports taking 10mg  oxycodone as well as x3 doses of Nitro from 0900-1030 w/out pain relief. Pt reports minimal SOA associated to CP. Pt reports intermittent incontinence for ~1-2 weeks. Pt A&Ox4 resting in bed, respirations even and non-labored. Skin is warm, dry, intact and appropriate for ethnicity. Pt hooked up to monitors. VSS. Bed is in lowest locked position, call light within reach. Belongings remain at bedside. Friend at bedside.  Medical History:   Diagnosis Date    Chronic back pain     Coronary artery disease     Ovarian cyst

## 2022-04-08 NOTE — Progress Notes
VAT consulted for PIV placement with lab draw.  VAT RN assessed patient's bilateral upper extremities with sono and was able to visualize two vessels that would house a 24 gauge to attempt PIV placement.  Otherwise, VAT was able to locate a vessel in the patient's right upper arm with sono that could house a 22 gauge.  VAT RN was able to obtain a 22gauge, 1.75 inch PIV with sono.  VAT was unable to visualize any PICC vessels in either upper extremity at this time.  Patient's primary nurse updated on the above assessment.

## 2022-04-12 ENCOUNTER — Encounter: Admit: 2022-04-12 | Discharge: 2022-04-12 | Payer: MEDICAID | Primary: Family

## 2022-05-28 IMAGING — MR L-spine^LUMBAR BLOCK
4 series · 41 of 48 positions shown · non-contrast
Comparison: none

[Series 2: T2 · sagittal · 4.0mm · 0.62mm/px · 10 of 13 slices shown (1 of 2)]
[im 1/13]
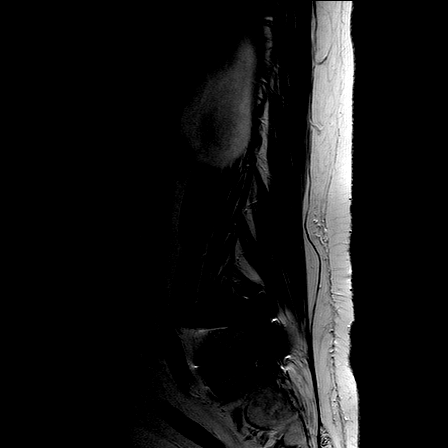
[im 2/13]
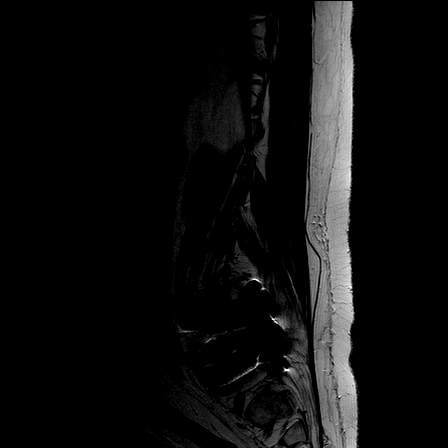
[im 3/13]
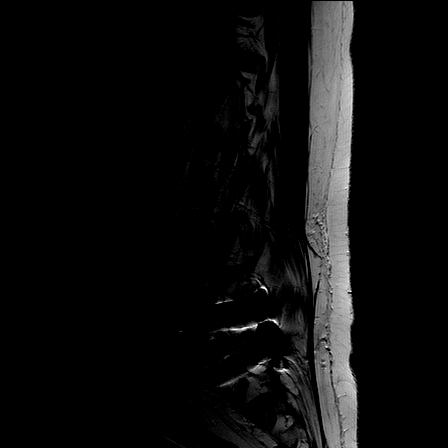
[im 5/13]
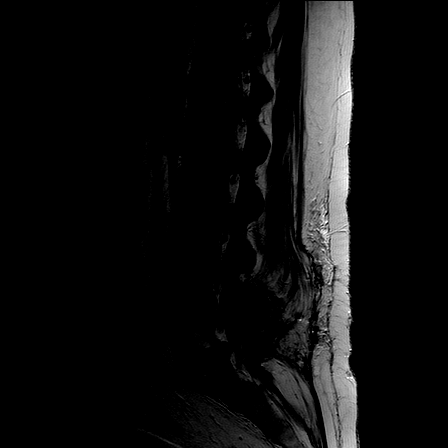
[im 6/13]
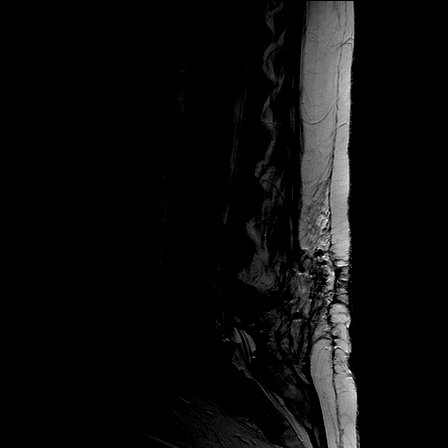
[im 7/13]
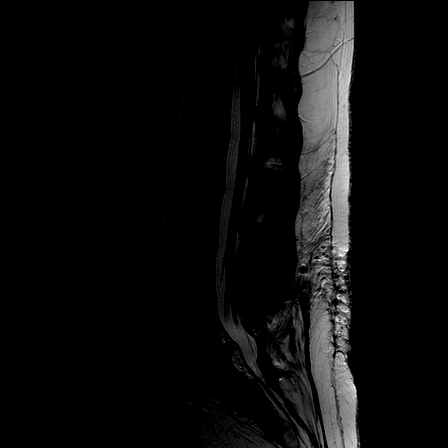
[im 9/13]
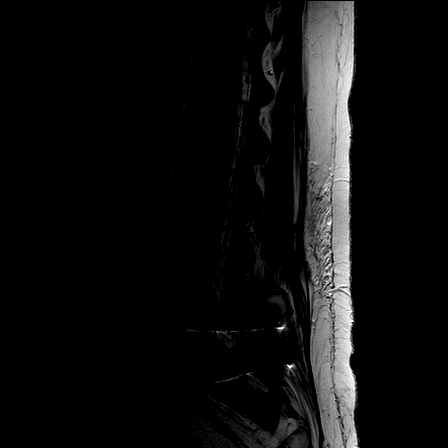
[im 10/13]
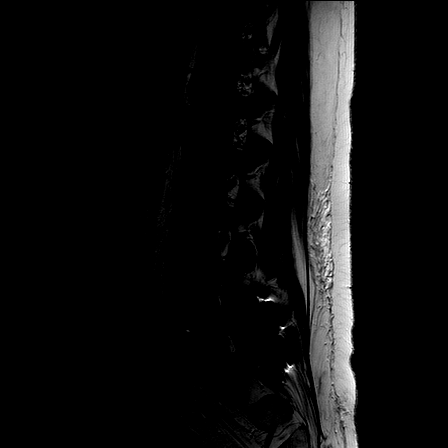
[im 11/13]
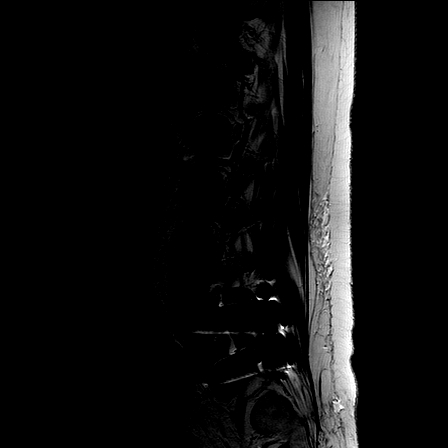
[im 13/13]
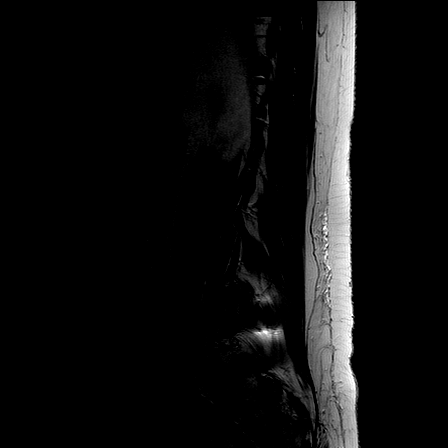

[Series 3: T1 · sagittal · 4.0mm · 0.73mm/px · 5 of 6 slices shown (1 of 2)]
[im 1/6]
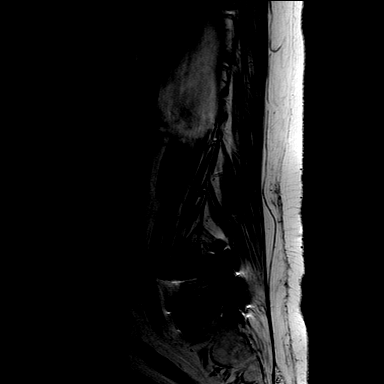
[im 2/6]
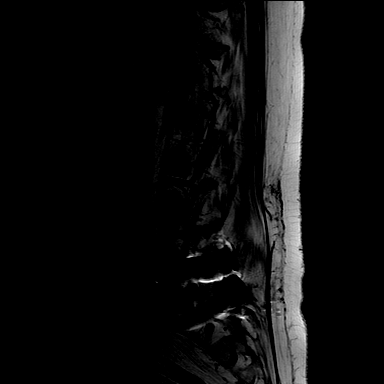
[im 3/6]
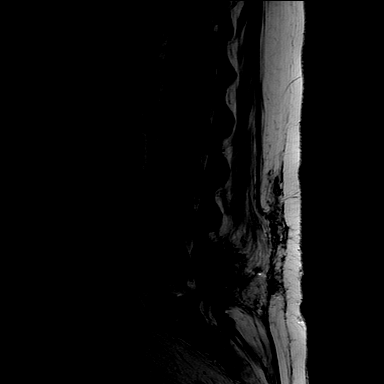
[im 4/6]
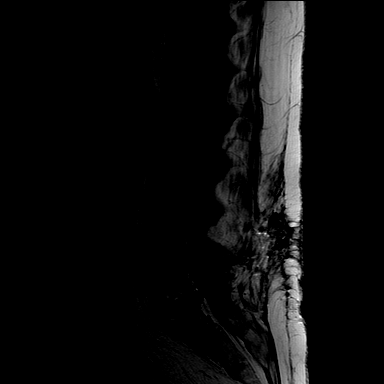
[im 6/6]
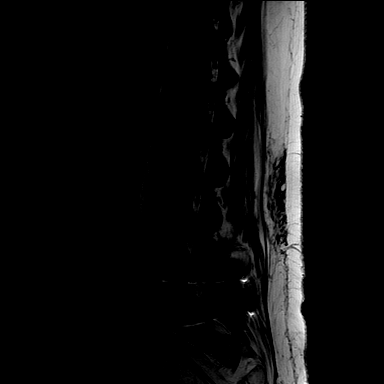

[Series 5: T2 · axial · 4.5mm · 0.49mm/px · z∈[-71,+36]mm · 9 of 11 slices shown (2 of 2)]
[im 1/11]
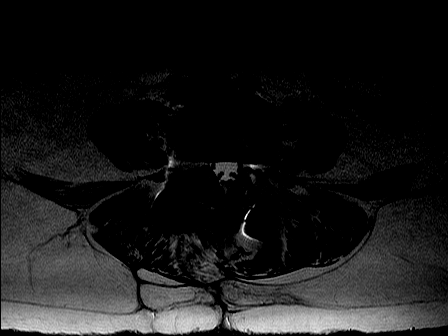
[im 2/11]
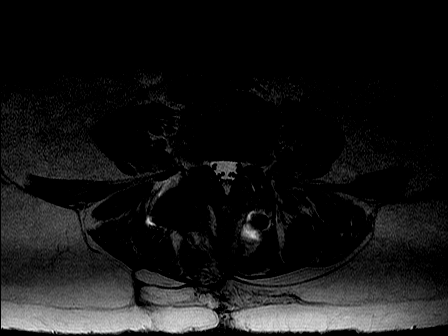
[im 3/11]
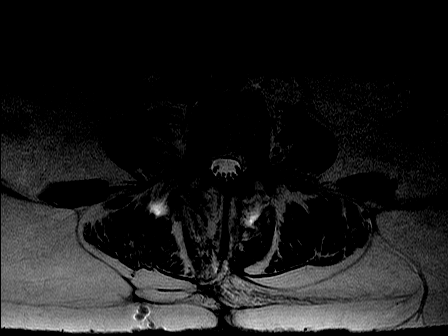
[im 4/11]
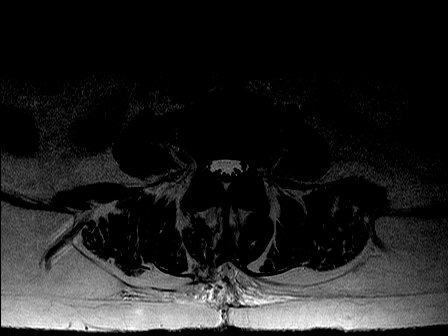
[im 6/11]
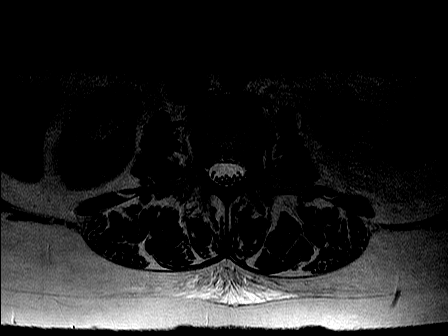
[im 7/11]
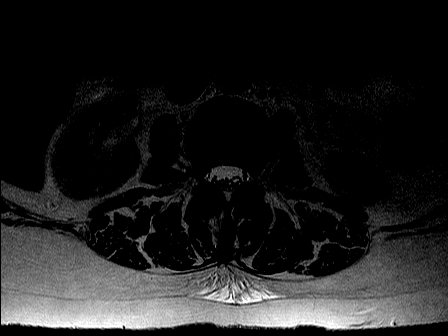
[im 8/11]
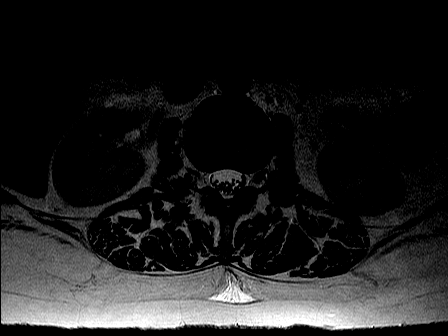
[im 9/11]
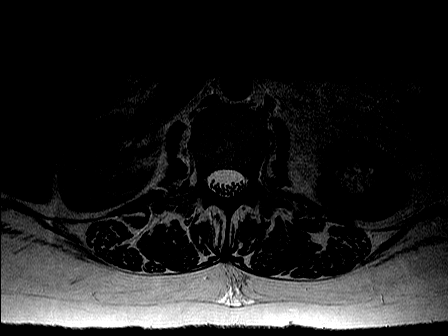
[im 11/11]
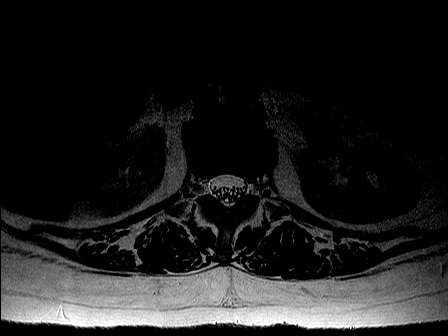

[Series 6: T1 · axial · 4.5mm · 0.86mm/px · z∈[-141,+30]mm · 17 of 29 slices shown (2 of 2)]
[im 1/29]
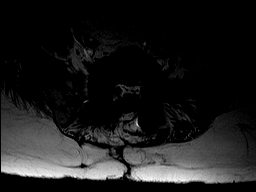
[im 2/29]
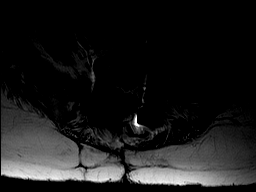
[im 3/29]
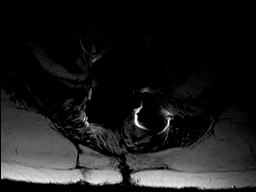
[im 4/29]
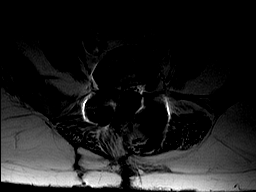
[im 5/29]
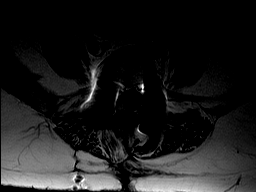
[im 7/29]
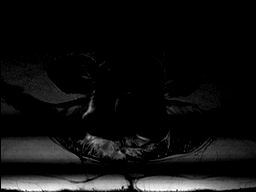
[im 8/29]
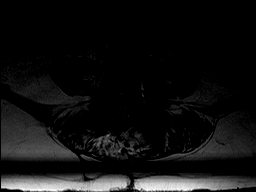
[im 9/29]
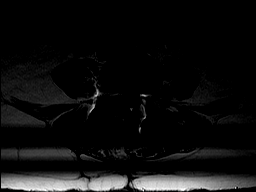
[im 10/29]
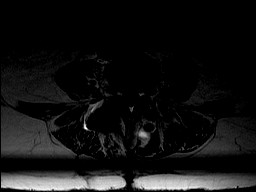
[im 11/29]
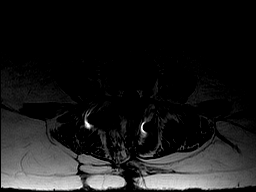
[im 13/29]
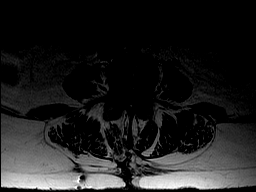
[im 15/29]
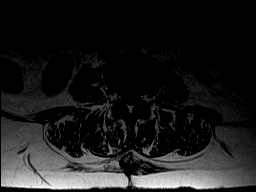
[im 16/29]
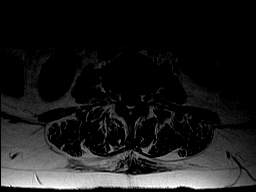
[im 20/29]
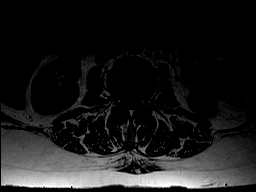
[im 24/29]
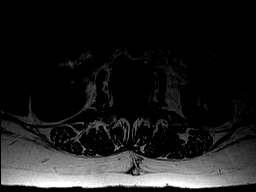
[im 25/29]
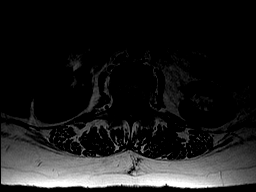
[im 27/29]
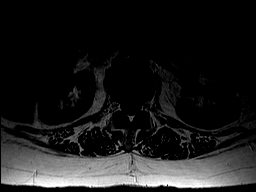

[41 of 48 positions shown; findings below may reference images not displayed]

04/12/21

EXAM

MR lumbar spine wo con

INDICATION

Recent onset of urinary incontinence, recent procedure

TECHNIQUE

MRI of the lumbar spine was performed with multiplanar fast spin echo sequences. No intravenous
contrast was administered.

COMPARISONS

None available at the time of dictation.

FINDINGS

Anatomy: Normal lordotic curvature of the lumbar spine. No spondylolisthesis. Postoperative changes
compatible with fusion hardware placement at L5-S1 with intervertebral disc spacer. Postoperative
changes compatible with posterior decompression at L5 and S1.

Vertebral bodies: No endplate compression fracture. Normal bone marrow signal.

Spinal canal: No spinal canal narrowing.

Intervertebral discs: No significant disc herniation.

Facets: Minimally exaggerated facet joint space loss at L4-5.  mild relative left foraminal
narrowing with contact with the exiting left L4 nerve root (series 2, image 4).

Visualized sacrum and bony pelvis: Normal bone marrow signal of the visualized sacrum and bony
pelvis.

Other findings: No abnormality of the visualized abdomen/pelvis.

IMPRESSION

1. No significant spinal canal narrowing.
2. Mild left foraminal narrowing with contact with the exiting left L4 nerve root.

Note: The following findings are so common in people without low back pain that while we report
their presence, they must be interpreted with caution and in the context of the clinical situation.
(Reference --Jarvik et al, Spine [id number])

Findings (prevalence in patients without low back pain)

Disc degeneration (decreased T2 signal, height loss, bulge) (91%)

Disc T2 - signal loss (83%)

Disc height loss (56%)

Disc bulge (64%)

Dis protrusion (32%)

Annular tear (38 [IP_ADDRESS]

Tech Notes:

CHRONIC LOW BACK PAIN.  PT STATES NEW ONSET INCONTINENCE.  RECENT LOW BACK FUSION.  PT REFUSED
CONTRAST MR DUE TO PAIN.  RG

## 2022-06-03 ENCOUNTER — Encounter: Admit: 2022-06-03 | Discharge: 2022-06-03 | Payer: MEDICAID | Primary: Family

## 2022-06-03 ENCOUNTER — Emergency Department: Admit: 2022-06-03 | Discharge: 2022-06-04 | Payer: MEDICAID

## 2022-06-03 ENCOUNTER — Emergency Department: Admit: 2022-06-03 | Discharge: 2022-06-03 | Payer: MEDICAID

## 2022-06-03 DIAGNOSIS — R079 Chest pain, unspecified: Secondary | ICD-10-CM

## 2022-06-03 LAB — HIGH SENSITIVITY TROPONIN I 0 HOUR: HIGH SENSITIVITY TROPONIN I 0 HOUR: 16 ng/L — ABNORMAL HIGH (ref <12)

## 2022-06-03 LAB — COMPREHENSIVE METABOLIC PANEL
ALBUMIN: 4 g/dL (ref 3.5–5.0)
ALK PHOSPHATASE: 122 U/L — ABNORMAL HIGH (ref 25–110)
ALT: 8 U/L (ref 7–56)
ANION GAP: 10 (ref 3–12)
AST: 28 U/L (ref 7–40)
CALCIUM: 9.2 mg/dL (ref 8.5–10.6)
CO2: 25 MMOL/L (ref 21–30)
CREATININE: 0.7 mg/dL (ref 0.4–1.00)
EGFR: >60 mL/min (ref >60)
SODIUM: 141 MMOL/L (ref 137–147)
TOTAL BILIRUBIN: 0.3 mg/dL (ref 0.3–1.2)
TOTAL PROTEIN: 7.1 g/dL (ref 6.0–8.0)

## 2022-06-03 LAB — CBC AND DIFF
ABSOLUTE BASO COUNT: 0.1 K/UL (ref 0–0.20)
ABSOLUTE EOS COUNT: 0.2 K/UL (ref 0–0.45)
ABSOLUTE LYMPH COUNT: 3.3 K/UL (ref 1.0–4.8)
ABSOLUTE MONO COUNT: 0.7 K/UL (ref 0–0.80)
ABSOLUTE NEUTROPHIL: 6.4 K/UL (ref 1.8–7.0)
BASOPHILS %: 1 % (ref 0–2)
EOSINOPHILS %: 3 % (ref 0–5)
HEMATOCRIT: 33 % — ABNORMAL LOW (ref 36–45)
HEMOGLOBIN: 10 g/dL — ABNORMAL LOW (ref 12.0–15.0)
LYMPHOCYTES %: 31 % (ref 24–44)
MCH: 23 pg — ABNORMAL LOW (ref 26–34)
MCHC: 31 g/dL — ABNORMAL LOW (ref 32.0–36.0)
MCV: 72 FL — ABNORMAL LOW (ref 80–100)
MDW (MONOCYTE DISTRIBUTION WIDTH): 23 — ABNORMAL HIGH (ref <20.7)
MONOCYTES %: 7 % (ref 4–12)
MPV: 8.8 FL (ref 7–11)
NEUTROPHILS %: 58 % (ref 41–77)
PLATELET COUNT: 285 K/UL (ref 150–400)
RBC COUNT: 4.6 M/UL — AB (ref 4.0–5.0)
RDW: 18 % — ABNORMAL HIGH (ref 11–15)
WBC COUNT: 11 K/UL — ABNORMAL LOW (ref 4.5–11.0)

## 2022-06-03 LAB — HIGH SENSITIVITY TROPONIN I 2 HOUR: HIGH SENSITIVITY TROPONIN I 2 HOUR: 16 ng/L — ABNORMAL HIGH (ref <12)

## 2022-06-03 LAB — PROTIME INR (PT): PROTIME: 11 s (ref 9.5–14.2)

## 2022-06-03 LAB — PTT (APTT): PTT: 28 s (ref 24.0–36.5)

## 2022-06-03 MED ORDER — FENTANYL CITRATE (PF) 50 MCG/ML IJ SOLN
50 ug | Freq: Once | INTRAVENOUS | 0 refills | Status: CP
Start: 2022-06-03 — End: ?
  Administered 2022-06-04: 03:00:00 50 ug via INTRAVENOUS

## 2022-06-03 MED ORDER — METOPROLOL SUCCINATE 100 MG PO TB24
100 mg | Freq: Once | ORAL | 0 refills | Status: CP
Start: 2022-06-03 — End: ?
  Administered 2022-06-04: 03:00:00 100 mg via ORAL

## 2022-06-04 NOTE — ED Notes
Pt ambulated to ED 8 with a steady gait. Pt presents with c/o chest pain. Pt has hx of CAD with stents approximately 3 months ago and recently had triple bypass on August 4th at [facility]. Pt reports intermittent pain between her shoulder blades that radiates into her mid chest and down her left arm starting earlier today. She took oxycodone, tylenol, and 3 nitro with minimal relief. Her last nitro was approximately 2 hours PTA. She reports lightheadedness and nausea. Pt's incision is C/D/I. Cart locked in lowest position, side rails up x2, and call light within reach.    Belongings retained by pt. 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

## 2022-06-04 NOTE — ED Notes
Patient has been given instructions regarding discharge. Patient verbalized understanding and denies having further questions at this time. Patient's PIV has been removed. Pt ambulates with steady gait, alert and oriented, and breathing unlabored. Pt's forms have been signed, belongings and paperwork with patient.

## 2022-06-04 NOTE — ED Provider Notes
Rhonda Prince is a 36 y.o. female.    Chief Complaint:  Chief Complaint   Patient presents with   ? Chest Pain     Int. All day, now getting worse, feels like when she had a triple bypass in August, crushing chest pain in center of chest that radiates to L arm; took 3 nitro with slight relief, also took tylenol and oxycodone without relief       History of Present Illness:  Rhonda Prince is a 36 y.o. female, with a past medical history of Chronic back pain, Coronary artery disease, and Ovarian cyst, who presents to the emergency department for chest pain. Patient reports h/o triple bypass surgery at [facility], on Aug 4th, for her chest pain. She now reports worsening crushing chest pain, that radiates to her left arm, neck, and back. She states the chest pain is similar to the pain before her triple bypass surgery. She called her cardiologist yesterday, but hasn't received a response. She endorses compliance with nitrile, tylenol, and other medications, w/o relief.      History provided by:  Patient and medical records  Language interpreter used: No        Review of Systems:  Review of Systems   Constitutional: Negative for fever.   HENT: Negative for sore throat.    Eyes: Negative for visual disturbance.   Respiratory: Negative for shortness of breath.    Cardiovascular: Positive for chest pain.   Gastrointestinal: Negative for abdominal pain.   Genitourinary: Negative for dysuria.   Musculoskeletal: Positive for back pain and neck pain.        Left arm pain     Skin: Negative for rash.   Neurological: Negative for headaches.       Allergies:  Pcn [penicillins]    Past Medical History:  Medical History:   Diagnosis Date   ? Chronic back pain    ? Coronary artery disease    ? Ovarian cyst        Past Surgical History:  Surgical History:   Procedure Laterality Date   ? HX HEART CATHETERIZATION  02/11/2022    Left heart catheterization with PCI to LAD   ? HX APPENDECTOMY     ? HX HYSTERECTOMY Pertinent medical/surgical history reviewed  Medical History:   Diagnosis Date   ? Chronic back pain    ? Coronary artery disease    ? Ovarian cyst      Surgical History:   Procedure Laterality Date   ? HX HEART CATHETERIZATION  02/11/2022    Left heart catheterization with PCI to LAD   ? HX APPENDECTOMY     ? HX HYSTERECTOMY         Social History:  Social History     Tobacco Use   ? Smoking status: Every Day     Packs/day: 1.50     Years: 11.00     Additional pack years: 0.00     Total pack years: 16.50     Types: Cigarettes   ? Smokeless tobacco: Never   Vaping Use   ? Vaping Use: Never used   Substance Use Topics   ? Alcohol use: Not Currently   ? Drug use: No     Social History     Substance and Sexual Activity   Drug Use No             Family History:  No family history on file.    Vitals:  ED  Vitals    Date and Time T BP P RR SPO2P SPO2 User   06/03/22 2219 -- 102/69 99 17 PER MINUTE 101 96 % MW   06/03/22 2200 -- -- 87 23 PER MINUTE 87 100 % MW   06/03/22 2030 -- 116/88 91 8 PER MINUTE 93 99 % MW   06/03/22 1849 -- 118/84 -- -- 96 100 % SB   06/03/22 1800 36.5 ?C (97.7 ?F) 136/85 -- 19 PER MINUTE 125 100 % SB          Physical Exam:  Physical Exam  Vitals and nursing note reviewed.   Constitutional:       Appearance: Normal appearance. She is normal weight.   HENT:      Head: Normocephalic and atraumatic.   Eyes:      Extraocular Movements: Extraocular movements intact.      Conjunctiva/sclera: Conjunctivae normal.   Cardiovascular:      Rate and Rhythm: Normal rate and regular rhythm.      Pulses: Normal pulses.   Pulmonary:      Effort: Pulmonary effort is normal.      Breath sounds: Normal breath sounds.   Abdominal:      General: Bowel sounds are normal. There is no distension.      Palpations: Abdomen is soft.      Tenderness: There is no abdominal tenderness.   Musculoskeletal:         General: Normal range of motion.      Cervical back: Neck supple.   Neurological:      Mental Status: She is oriented to person, place, and time. Mental status is at baseline.   Psychiatric:         Mood and Affect: Mood normal.         Behavior: Behavior normal.         Laboratory Results:  Labs Reviewed   COMPREHENSIVE METABOLIC PANEL - Abnormal       Result Value Ref Range Status    Sodium 141  137 - 147 MMOL/L Final    Potassium 4.5  3.5 - 5.1 MMOL/L Final    Chloride 106  98 - 110 MMOL/L Final    Glucose 84  70 - 100 MG/DL Final    Blood Urea Nitrogen 4 (*) 7 - 25 MG/DL Final    Creatinine 1.61  0.4 - 1.00 MG/DL Final    Calcium 9.2  8.5 - 10.6 MG/DL Final    Total Protein 7.1  6.0 - 8.0 G/DL Final    Total Bilirubin 0.3  0.3 - 1.2 MG/DL Final    Albumin 4.0  3.5 - 5.0 G/DL Final    Alk Phosphatase 122 (*) 25 - 110 U/L Final    AST (SGOT) 28  7 - 40 U/L Final    CO2 25  21 - 30 MMOL/L Final    ALT (SGPT) 8  7 - 56 U/L Final    Anion Gap 10  3 - 12 Final    eGFR >60  >60 mL/min Final   HIGH SENSITIVITY TROPONIN I 0 HOUR - Abnormal    hs Troponin I 0 Hour 16 (*) <12 ng/L Final   HIGH SENSITIVITY TROPONIN I 2 HOUR - Abnormal    hs Troponin I 2 Hour 16 (*) <12 ng/L Final   CBC AND DIFF - Abnormal    White Blood Cells 11.0  4.5 - 11.0 K/UL Final    RBC 4.64  4.0 - 5.0  M/UL Final    Hemoglobin 10.7 (*) 12.0 - 15.0 GM/DL Final    Hematocrit 45.4 (*) 36 - 45 % Final    MCV 72.9 (*) 80 - 100 FL Final    MCH 23.0 (*) 26 - 34 PG Final    MCHC 31.6 (*) 32.0 - 36.0 G/DL Final    RDW 09.8 (*) 11 - 15 % Final    Platelet Count 285  150 - 400 K/UL Final    MPV 8.8  7 - 11 FL Final    Neutrophils 58  41 - 77 % Final    Lymphocytes 31  24 - 44 % Final    Monocytes 7  4 - 12 % Final    Eosinophils 3  0 - 5 % Final    Basophils 1  0 - 2 % Final    Absolute Neutrophil Count 6.46  1.8 - 7.0 K/UL Final    Absolute Lymph Count 3.36  1.0 - 4.8 K/UL Final    Absolute Monocyte Count 0.76  0 - 0.80 K/UL Final    Absolute Eosinophil Count 0.28  0 - 0.45 K/UL Final    Absolute Basophil Count 0.14  0 - 0.20 K/UL Final    MDW (Monocyte Distribution Width) 23.6 (*) <20.7 Final   PROTIME INR (PT)    Protime 11.2  9.5 - 14.2 SEC Final    INR 1.0  0.8 - 1.2 Final   PTT (APTT)    APTT 28.8  24.0 - 36.5 SEC Final          Radiology Interpretation:    CHEST SINGLE VIEW   Final Result         1. Slightly increased linear opacities in the left lung base favored to represent atelectasis. Infection and aspiration are felt to be less likely differential considerations.   2. Probable small left pleural effusion.          Finalized by Maida Sale, MD on 06/03/2022 7:53 PM. Dictated by Maida Sale, MD on 06/03/2022 7:51 PM.             EKG:     Sinus tach, no acute ischemic changes, no significant change from prior,    Medical Decision Making:  Rhonda Prince is a 36 y.o. female who presents with chief complaint as listed above. Based on the history and presentation, the list of differential diagnoses considered included, but was not limited to, ischemic chest pain versus post surgical pain versus chronic chest pain of uncertain etiology.    ED Course  Patient presents with severe chest pain that is the same as prior to my bypass she is tearful and requesting narcotic pain medicine stating that nitro does not help.  She has been seen at other hospitals over the past few days days as well.  Pain was relieved with a dose of fentanyl and patient was sleeping soundly.  She has been out of her metoprolol so we dosed her with that.    Her troponin was 16 both at the 0 and 2-hour timeframe.  Given the severity and the duration of her pain I would expect there to be a more elevated and escalating troponin if there were cardiac ischemia.  Case was discussed with the cardiologist on-call who agreed with the plan of discharge and follow-up with her primary cardiologist/cardiothoracic surgeon.       Complexity of Problems Addressed  Patient's active diagnoses as well as contributing pre-existing medical problems  include:  Clinical Impression   Chest pain, unspecified type     Evaluation performed for potential threat to life or bodily function during this visit given the initial differential diagnosis and clinical impression(s) as discussed previously in MDM/ED course.    Additional data reviewed:    ? History was obtained from an independent historian: Family and Caregiver  ? Prior non-ED notes reviewed: Recent discharge summary and/or H&P, Clinic note, Outside ED record and Documentation from other hospital  ? Independent interpretation of diagnostic tests was performed by me: Not in addition to what is mentioned above  ? Patient presentation/management was discussed with the following qualified health care professionals and/or other relevant professionals: Consultant    Risk evaluation:    ? Diagnosis or treatment of patient condition impacted by social determinant of health: Problems related to health literacy, Problems related to social support, potentially including family circumstances and Problems related to psychosocial circumstances  ? Tests Considered but not performed due to clinical scoring (if not mentioned in ED course, aside from what is implied by clinical scores listed): N/A  ? Rationale regarding whether admission or escalation of care considered if not performed (if not mentioned in ED course, aside from what is implied by clinical scores listed): N/A    ED Scoring:                                Facility Administered Meds:  Medications   fentaNYL citrate PF (SUBLIMAZE) injection 50 mcg (50 mcg Intravenous Given 06/03/22 2218)   metoprolol succinate XL (TOPROL XL) tablet 100 mg (100 mg Oral Given 06/03/22 2219)       Clinical Impression:  Clinical Impression   Chest pain, unspecified type       Disposition/Follow up  ED Disposition     ED Disposition   Discharge           Sharene Butters, NP  [address]  [place] [address]  [phone]    Schedule an appointment as soon as possible for a visit         Medications:  New Prescriptions    No medications on file       Procedure Notes:  Procedures       Attestation / Supervision:  Verneda Skill, am scribing for and in the presence of Lennette Bihari, MD.    Katy Fitch    Attestation / Supervision Note concerning Lonny Prude: I personally performed the E/M including history, physical exam, and MDM. and I, Lennette Bihari MD, personally performed the services described in this documentation as scribed in my presence and it is both accurate and complete.    Verl Bangs, MD

## 2022-06-07 ENCOUNTER — Encounter: Admit: 2022-06-07 | Discharge: 2022-06-07 | Payer: MEDICAID | Primary: Family

## 2022-08-24 ENCOUNTER — Encounter: Admit: 2022-08-24 | Discharge: 2022-08-24 | Payer: MEDICAID | Primary: Family

## 2022-12-04 ENCOUNTER — Emergency Department: Admit: 2022-12-04 | Discharge: 2022-12-04 | Payer: MEDICAID

## 2022-12-04 ENCOUNTER — Encounter: Admit: 2022-12-04 | Discharge: 2022-12-04 | Payer: MEDICAID | Primary: Family

## 2022-12-04 DIAGNOSIS — R231 Pallor: Secondary | ICD-10-CM

## 2022-12-04 LAB — COMPREHENSIVE METABOLIC PANEL
BLD UREA NITROGEN: 5 mg/dL — ABNORMAL LOW (ref 7–25)
CALCIUM: 8.8 mg/dL (ref 8.5–10.6)
CHLORIDE: 103 MMOL/L — ABNORMAL LOW (ref 98–110)
CREATININE: 0.8 mg/dL — ABNORMAL HIGH (ref 0.4–1.00)
GLUCOSE,PANEL: 87 mg/dL (ref 70–100)
POTASSIUM: 4.4 MMOL/L — ABNORMAL LOW (ref 3.5–5.1)
SODIUM: 141 MMOL/L (ref 137–147)
TOTAL BILIRUBIN: 0.3 mg/dL — ABNORMAL LOW (ref 0.3–1.2)
TOTAL PROTEIN: 7.2 g/dL (ref 6.0–8.0)

## 2022-12-04 LAB — PROTIME INR (PT): PROTIME: 12 s — ABNORMAL HIGH (ref 9.5–14.2)

## 2022-12-04 LAB — CBC AND DIFF: WBC COUNT: 15 K/UL — ABNORMAL HIGH (ref 4.5–11.0)

## 2022-12-04 LAB — PTT (APTT): PTT: 40 s — ABNORMAL HIGH (ref 24.0–36.5)

## 2022-12-04 MED ORDER — MORPHINE 4 MG/ML IV SYRG
4 mg | Freq: Once | INTRAVENOUS | 0 refills | Status: CP
Start: 2022-12-04 — End: ?
  Administered 2022-12-04: 21:00:00 4 mg via INTRAVENOUS

## 2022-12-04 MED ORDER — MORPHINE 4 MG/ML IV SYRG
4 mg | Freq: Once | INTRAVENOUS | 0 refills | Status: CP
Start: 2022-12-04 — End: ?
  Administered 2022-12-04: 19:00:00 4 mg via INTRAVENOUS

## 2022-12-04 NOTE — Unmapped
You were seen in the emergency department for leg pain.  You had labs and an ultrasound done.  Your ultrasound did not show any acute findings.  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  This does not require any acute intervention.  We would recommend you follow-up with your primary care provider as well as rheumatology.  We have provided their clinic number for you.  Please continue to take all your medications as prescribed.  If you have any worsening symptoms, please return to the emergency department.

## 2022-12-04 NOTE — ED Provider Notes
Rhonda Prince is a 37 y.o. female.    Chief Complaint:  Chief Complaint   Patient presents with    Leg Pain     Bilat leg mottling and pain. Legs feel cool to touch. Pulses palpable. Reports seen at [facility] yesterday and had xray and US done on R leg and told it was all ok, but wanting a second opinion       History of Present Illness:  37 year old female with history of CAD s/p CABG and chronic back pain who presents with leg pain and skin discoloration.  Patient states that for the last week she has had worsening pain in her legs as well as discoloration of her skin.  Patient also reports that her legs feel cold.  Patient was seen at another ED yesterday where she had labs and imaging done and was told that they were normal.  Patient is on Xarelto and Plavix and reports compliance with these medications.  She denies any chest pain or shortness of breath.  Patient also reports skin changes in her hands that she noticed today.  She has no other acute concerns at this time.        Review of Systems:  Review of Systems   Constitutional:  Negative for chills and fever.   Respiratory:  Negative for shortness of breath.    Cardiovascular:  Negative for chest pain.   Musculoskeletal:  Positive for arthralgias (Bilateral leg pain).   Skin:  Positive for color change (Mottling to bilateral lower extremities).       Allergies:  Pcn [penicillins]    Past Medical History:  Medical History:   Diagnosis Date    Chronic back pain     Coronary artery disease     Ovarian cyst        Past Surgical History:  Surgical History:   Procedure Laterality Date    HX HEART CATHETERIZATION  02/11/2022    Left heart catheterization with PCI to LAD    HX APPENDECTOMY      HX HYSTERECTOMY         Pertinent medical/surgical history reviewed    Social History:  Social History     Tobacco Use    Smoking status: Every Day     Current packs/day: 1.50     Average packs/day: 1.5 packs/day for 11.0 years (16.5 ttl pk-yrs)     Types: Cigarettes Smokeless tobacco: Never   Vaping Use    Vaping status: Never Used   Substance Use Topics    Alcohol use: Not Currently    Drug use: No     Social History     Substance and Sexual Activity   Drug Use No             Family History:  No family history on file.    Vitals:  ED Vitals      Date and Time T BP P RR SPO2P SPO2 User   12/04/22 1523 -- 146/107 46 -- -- 95 % KB   12/04/22 1400 -- 124/89 86 -- -- 98 % KB   12/04/22 1318 -- 106/86 99 20 PER MINUTE -- 100 % KAN   12/04/22 1222 -- 140/96 98 20 PER MINUTE -- 100 % KAN            Physical Exam:  Physical Exam  Vitals and nursing note reviewed.   Constitutional:       General: She is not in acute distress.     Appearance:  Normal appearance.   HENT:      Head: Normocephalic and atraumatic.      Right Ear: External ear normal.      Left Ear: External ear normal.      Nose: Nose normal.      Mouth/Throat:      Mouth: Mucous membranes are moist.      Pharynx: Oropharynx is clear.   Eyes:      Conjunctiva/sclera: Conjunctivae normal.   Cardiovascular:      Rate and Rhythm: Normal rate.      Pulses: Normal pulses.   Pulmonary:      Effort: Pulmonary effort is normal.      Breath sounds: Normal breath sounds.   Musculoskeletal:         General: Normal range of motion.      Cervical back: Neck supple.   Skin:     General: Skin is warm and dry.      Comments: Mottling skin changes to bilateral lower extremities.  Palpable pulses.  No sensory deficits.   Neurological:      General: No focal deficit present.      Mental Status: She is alert and oriented to person, place, and time.         Laboratory Results:  Labs Reviewed   CBC AND DIFF - Abnormal       Result Value Ref Range Status    White Blood Cells 15.5 (*) 4.5 - 11.0 K/UL Final    RBC 5.03 (*) 4.0 - 5.0 M/UL Final    Hemoglobin 12.2  12.0 - 15.0 GM/DL Final    Hematocrit 16.1  36 - 45 % Final    MCV 75.4 (*) 80 - 100 FL Final    MCH 24.1 (*) 26 - 34 PG Final    MCHC 32.0  32.0 - 36.0 G/DL Final    RDW 09.6 (*) 11 - 15 % Final    Platelet Count 393  150 - 400 K/UL Final    MPV 7.3  7 - 11 FL Final    Neutrophils 73  41 - 77 % Final    Lymphocytes 21 (*) 24 - 44 % Final    Monocytes 4  4 - 12 % Final    Eosinophils 1  0 - 5 % Final    Basophils 1  0 - 2 % Final    Absolute Neutrophil Count 11.25 (*) 1.8 - 7.0 K/UL Final    Absolute Lymph Count 3.26  1.0 - 4.8 K/UL Final    Absolute Monocyte Count 0.64  0 - 0.80 K/UL Final    Absolute Eosinophil Count 0.17  0 - 0.45 K/UL Final    Absolute Basophil Count 0.17  0 - 0.20 K/UL Final    MDW (Monocyte Distribution Width) 18.8  <20.7 Final   PTT (APTT) - Abnormal    APTT 40.0 (*) 24.0 - 36.5 SEC Final   COMPREHENSIVE METABOLIC PANEL - Abnormal    Sodium 141  137 - 147 MMOL/L Final    Potassium 4.4  3.5 - 5.1 MMOL/L Final    Chloride 103  98 - 110 MMOL/L Final    Glucose 87  70 - 100 MG/DL Final    Blood Urea Nitrogen 5 (*) 7 - 25 MG/DL Final    Creatinine 0.45  0.4 - 1.00 MG/DL Final    Calcium 8.8  8.5 - 10.6 MG/DL Final    Total Protein 7.2  6.0 - 8.0 G/DL Final  Total Bilirubin 0.3  0.3 - 1.2 MG/DL Final    Albumin 3.8  3.5 - 5.0 G/DL Final    Alk Phosphatase 124 (*) 25 - 110 U/L Final    AST (SGOT) 17  7 - 40 U/L Final    CO2 27  21 - 30 MMOL/L Final    ALT (SGPT) 17  7 - 56 U/L Final    Anion Gap 11  3 - 12 Final    eGFR >60  >60 mL/min Final   PROTIME INR (PT)    Protime 12.7  9.5 - 14.2 SEC Final    INR 1.1  0.8 - 1.2 Final          Radiology Interpretation:  US DOPPLER ART LE BILATERAL   Final Result         Normal arterial Doppler exam without evidence of focal stenosis or significant arterial insufficiency.      By my electronic signature, I attest that I have personally reviewed the images for this examination and formulated the interpretations and opinions expressed in this report          Finalized by Mallie Darting, M.D. on 12/04/2022 3:22 PM. Dictated by Alinda Dooms, M.D. on 12/04/2022 3:10 PM.             EKG:      Medical Decision Making:  Rhonda Prince is a 37 y.o. female who presents with chief complaint as listed above. Based on the history and presentation, the list of differential diagnoses considered included, but was not limited to, livedo reticularis, other rash, other vasculitis, arterial stenosis.    ED Course  Patient presents with leg pain.  Triage note and vital signs reviewed.  Patient seen and examined.  Skin changes noted to bilateral lower extremities.  See pictures above.  Palpable pulses present and no sensory deficits noted on exam.    Patient's presentation concerning for possible peripheral arterial disease.  Will obtain arterial ultrasound of bilateral lower extremities.  Lab work also obtained.    Lab significant for WBC 15.5.  Patient does not have any symptoms or signs of infection.  No need for infectious treatment at this time.    Ultrasound negative for acute findings.    Given patient's reassuring workup, suspect patient's symptoms are related to livedo reticularis.  This was discussed with patient.  Discussed that patient should follow-up with rheumatology as well as her primary care doctor.  Patient given morphine for pain control.  Patient appropriate for discharge.  Return precautions given.  Patient discharged in stable condition.    Complexity of Problems Addressed  Patient's active diagnoses as well as contributing pre-existing medical problems include:  Clinical Impression   Livedo reticularis     Evaluation performed for potential threat to life or bodily function during this visit given the initial differential diagnosis and clinical impression(s) as discussed previously in MDM/ED course.    Additional data reviewed:    History was obtained from an independent historian: Not in addition to what is mentioned above  Prior non-ED notes reviewed: Outside ED record  Independent interpretation of diagnostic tests was performed by me: Not in addition to what is mentioned above  Patient presentation/management was discussed with the following qualified health care professionals and/or other relevant professionals: Not in addition to what is mentioned above    Risk evaluation:    Diagnosis or treatment of patient condition impacted by social determinant of health: Problems related to health literacy  Tests  Considered but not performed due to clinical scoring (if not mentioned in ED course, aside from what is implied by clinical scores listed): NA  Rationale regarding whether admission or escalation of care considered if not performed (if not mentioned in ED course, aside from what is implied by clinical scores listed): NA    ED Scoring:                                Facility Administered Meds:  Medications   morphine injection 4 mg (4 mg Intravenous Given 12/04/22 1412)   morphine injection 4 mg (4 mg Intravenous Given 12/04/22 1617)       Clinical Impression:  Clinical Impression   Livedo reticularis       Disposition/Follow up  ED Disposition     ED Disposition   Discharge           Sharene Butters, NP  [address]  [place] [address]  [phone]    Schedule an appointment as soon as possible for a visit       Rheumatology: [facility].  [address]  [place] [address]  [phone]  Schedule an appointment as soon as possible for a visit         Medications:  Discharge Medication List as of 12/04/2022  4:08 PM          Procedure Notes:  Procedures       Attestation / Supervision:        Clementeen Graham, MD  Emergency Medicine, PGY-3

## 2022-12-04 NOTE — ED Notes
Patient educated on discharge instructions, home care, and follow up care. Patient verbalized understanding and had all questions answered by this RN at this time. Pt AOX4, VSS, and PIV removed. Pt ambulates out of department with steady gait.

## 2022-12-04 NOTE — ED Notes
ED Initial Provider Note:    This patient was seen in the ED triage area to initiate and expedite the patients ED care when possible.    ED Chief Complaint:   Chief Complaint   Patient presents with    Leg Pain     Bilat leg mottling and pain. Legs feel cool to touch. Pulses palpable. Reports seen at [facility] yesterday and had xray and US done on R leg and told it was all ok, but wanting a second opinion       S: Rhonda Prince is a 37 y.o. female who presents to the Emergency Department for Bilateral leg pain and mottling. Pt was seen yesterday with [facility] and had an [place] and xray on bilateral legs and they were normal but now the left leg started with pain.     PMHx:  Medical History:   Diagnosis Date    Chronic back pain     Coronary artery disease     Ovarian cyst        LMP 03/05/2013    O: Brief Physical: mottling to bilateral legs. Pulses faint.     A/P: The patient was seen by me as an initial provider in triage. A brief history and physical was obtained. My exam is intended to be an initial medial screening exam. Initial orders have been placed by me. My working diagnosis is PAD.    The patient is deemed appropriate for the main ED. The patient's care will be resumed by the ED provider care team once the patient is roomed in the ED. A more detailed / complete H&P will be documented by those providers.

## 2022-12-04 NOTE — ED Notes
See provider in triage note.

## 2022-12-24 ENCOUNTER — Emergency Department: Admit: 2022-12-24 | Discharge: 2022-12-25 | Payer: MEDICAID

## 2022-12-24 ENCOUNTER — Emergency Department: Admit: 2022-12-24 | Discharge: 2022-12-24 | Payer: MEDICAID

## 2022-12-24 ENCOUNTER — Encounter: Admit: 2022-12-24 | Discharge: 2022-12-24 | Payer: MEDICAID | Primary: Family

## 2022-12-24 DIAGNOSIS — J069 Acute upper respiratory infection, unspecified: Secondary | ICD-10-CM

## 2022-12-24 DIAGNOSIS — R52 Pain, unspecified: Secondary | ICD-10-CM

## 2022-12-24 DIAGNOSIS — D72825 Bandemia: Secondary | ICD-10-CM

## 2022-12-24 LAB — COMPREHENSIVE METABOLIC PANEL
ALK PHOSPHATASE: 105 U/L (ref 25–110)
ALT: 6 U/L — ABNORMAL LOW (ref 7–56)
ANION GAP: 13 K/UL — ABNORMAL HIGH (ref 3–12)
AST: 18 U/L (ref 7–40)
CO2: 25 MMOL/L (ref 21–30)
EGFR: >60 mL/min (ref >60)
GLUCOSE,PANEL: 87 mg/dL — ABNORMAL LOW (ref 70–100)
SODIUM: 138 MMOL/L (ref 137–147)

## 2022-12-24 LAB — INFLUENZA A/B AND RSV PCR
FLU B: NEGATIVE mg/dL (ref 0.3–1.2)
RSV: NEGATIVE % (ref 41–77)

## 2022-12-24 LAB — COVID-19 (SARS-COV-2) PCR

## 2022-12-24 LAB — POC LACTATE: LACTIC ACID POC: 1.6 MMOL/L (ref 0.5–2.0)

## 2022-12-24 LAB — MAGNESIUM: MAGNESIUM: 1.9 mg/dL (ref 1.6–2.6)

## 2022-12-24 LAB — CBC AND DIFF
ABSOLUTE BASO COUNT: 0.1 K/UL (ref 0–0.20)
ABSOLUTE EOS COUNT: 0.3 K/UL (ref 0–0.45)
ABSOLUTE MONO COUNT: 0.8 K/UL — ABNORMAL HIGH (ref 0–0.80)
MCH: 24 pg — ABNORMAL LOW (ref 26–34)
MDW (MONOCYTE DISTRIBUTION WIDTH): 19 (ref <20.7)
WBC COUNT: 16 K/UL — ABNORMAL HIGH (ref 4.5–11.0)

## 2022-12-24 LAB — HIGH SENSITIVITY TROPONIN I 0 HOUR: HIGH SENSITIVITY TROPONIN I 0 HOUR: 4 ng/L — ABNORMAL LOW (ref <12)

## 2022-12-24 LAB — HIGH SENSITIVITY TROPONIN I 2 HOUR: HIGH SENSITIVITY TROPONIN I 2 HOUR: 3 ng/L (ref <12)

## 2022-12-24 MED ORDER — LACTATED RINGERS IV SOLP
500 mL | INTRAVENOUS | 0 refills | Status: CP
Start: 2022-12-24 — End: ?
  Administered 2022-12-25: 500 mL via INTRAVENOUS

## 2022-12-24 MED ORDER — DOXYCYCLINE HYCLATE 100 MG PO TAB
100 mg | ORAL_TABLET | Freq: Two times a day (BID) | ORAL | 0 refills | 8.00000 days | Status: AC
Start: 2022-12-24 — End: ?

## 2022-12-24 MED ORDER — POTASSIUM CHLORIDE 20 MEQ PO TBTQ
40 meq | Freq: Once | ORAL | 0 refills | Status: CP
Start: 2022-12-24 — End: ?
  Administered 2022-12-24: 23:00:00 40 meq via ORAL

## 2022-12-24 MED ORDER — ASPIRIN 81 MG PO CHEW
324 mg | ORAL_TABLET | Freq: Once | ORAL | 0 refills | Status: CP
Start: 2022-12-24 — End: ?
  Administered 2022-12-24: 23:00:00 324 mg via ORAL

## 2022-12-24 MED ORDER — IBUPROFEN 600 MG PO TAB
600 mg | Freq: Once | ORAL | 0 refills | Status: DC
Start: 2022-12-24 — End: 2022-12-25

## 2022-12-24 MED ORDER — ACETAMINOPHEN 325 MG PO TAB
650 mg | Freq: Once | ORAL | 0 refills | Status: CP
Start: 2022-12-24 — End: ?
  Administered 2022-12-24: 23:00:00 650 mg via ORAL

## 2022-12-24 NOTE — ED Notes
37 y/o F reports to ED9 with cc of generalized body pain. Pt reports she has had CP for about the last 4 days, which got worse this AM. Pt also endorses general body pain and fatigue throughout the same time, denies being around anyone sick. Pt reports that she went to [facility], and they told her multiple possible diagnoses and didn't give her any definitive answers.     Medical History:   Diagnosis Date    Chronic back pain     Coronary artery disease     Ovarian cyst      Pt. A&Ox4, breathing even and unlabored on RA, skin race appropriate, pt on cardiac monitors, bed in lowest locked position, call light within reach

## 2022-12-24 NOTE — ED Provider Notes
Rhonda Prince is a 37 y.o. female.    Chief Complaint:  Chief Complaint   Patient presents with    Chest Pain     CP x 2 days, SOA and cough x 4 days       History of Present Illness:  Patient is a 38 year old female with past medical history significant for HLD, HFrEF, CAD s/p CABG, PSVT presents today for chest pain.  Patient states that she has had intermittent chest pain for 4 days.  Patient states that the chest pain feels like pressure, was intermittent, but today has been constant.  Patient denies any aggravating or relieving factors.  Patient endorses associated shortness of breath, cough, sore throat over the same time.  Patient endorses generalized bodyaches.  Patient's family member at bedside had a cough last week. Patient denies fever, abdominal pain, nausea, vomiting, constipation, diarrhea, hematochezia, melena, dysuria, hematuria.          Review of Systems:  Review of Systems   Constitutional:  Negative for fever.   HENT:  Positive for sore throat.    Eyes:  Negative for visual disturbance.   Respiratory:  Positive for cough and shortness of breath.    Cardiovascular:  Positive for chest pain. Negative for palpitations and leg swelling.   Gastrointestinal:  Negative for abdominal pain, blood in stool, constipation, diarrhea, nausea and vomiting.   Genitourinary:  Negative for dysuria and hematuria.   Musculoskeletal:  Negative for back pain.   Skin:  Negative for rash.   Neurological:  Negative for headaches.   Psychiatric/Behavioral:  Negative for confusion.        Allergies:  Pcn [penicillins]    Past Medical History:  Medical History:   Diagnosis Date    Chronic back pain     Coronary artery disease     Ovarian cyst        Past Surgical History:  Surgical History:   Procedure Laterality Date    HX HEART CATHETERIZATION  02/11/2022    Left heart catheterization with PCI to LAD    HX APPENDECTOMY      HX HYSTERECTOMY         Pertinent medical/surgical history reviewed    Social History:  Social History     Tobacco Use    Smoking status: Every Day     Current packs/day: 1.50     Average packs/day: 1.5 packs/day for 11.0 years (16.5 ttl pk-yrs)     Types: Cigarettes    Smokeless tobacco: Never   Vaping Use    Vaping status: Never Used   Substance Use Topics    Alcohol use: Not Currently    Drug use: No     Social History     Substance and Sexual Activity   Drug Use No             Family History:  No family history on file.    Vitals:  ED Vitals      Date and Time T BP P RR SPO2P SPO2 User   12/24/22 1901 -- -- -- -- -- 92 % TA   12/24/22 1900 -- 95/67 102 20 PER MINUTE -- -- TA   12/24/22 1723 -- 98/58 101 11 PER MINUTE -- 96 % HP   12/24/22 1601 -- -- -- -- -- 98 % TA   12/24/22 1600 -- 122/96 102 14 PER MINUTE -- -- TA   12/24/22 1540 36.6 ?C (97.8 ?F) 92/58 103 16 PER MINUTE -- 100 %  JF            Physical Exam:  Physical Exam  Vitals and nursing note reviewed.   Constitutional:       General: She is not in acute distress.     Appearance: Normal appearance.   HENT:      Head: Normocephalic and atraumatic.      Right Ear: External ear normal.      Left Ear: External ear normal.      Nose: Nose normal.      Mouth/Throat:      Mouth: Mucous membranes are moist.      Pharynx: Oropharynx is clear.   Eyes:      Extraocular Movements: Extraocular movements intact.      Conjunctiva/sclera: Conjunctivae normal.   Cardiovascular:      Rate and Rhythm: Regular rhythm. Tachycardia present.      Pulses: Normal pulses.      Heart sounds: Normal heart sounds. No murmur heard.     No gallop.   Pulmonary:      Effort: Pulmonary effort is normal. No respiratory distress.      Breath sounds: Normal breath sounds. No wheezing or rales.   Abdominal:      General: Abdomen is flat. Bowel sounds are normal. There is no distension.      Palpations: Abdomen is soft.      Tenderness: There is no abdominal tenderness. There is no guarding.   Musculoskeletal:         General: Normal range of motion.      Cervical back: Normal range of motion and neck supple. No rigidity.      Right lower leg: No edema.      Left lower leg: No edema.   Skin:     General: Skin is warm and dry.      Capillary Refill: Capillary refill takes less than 2 seconds.   Neurological:      Mental Status: She is alert and oriented to person, place, and time. Mental status is at baseline.      Sensory: No sensory deficit.      Motor: No weakness.   Psychiatric:         Mood and Affect: Mood normal.         Behavior: Behavior normal.         Laboratory Results:  Labs Reviewed   CBC AND DIFF - Abnormal       Result Value Ref Range Status    White Blood Cells 16.8 (*) 4.5 - 11.0 K/UL Final    RBC 4.78  4.0 - 5.0 M/UL Final    Hemoglobin 11.9 (*) 12.0 - 15.0 GM/DL Final    Hematocrit 16.1  36 - 45 % Final    MCV 76.0 (*) 80 - 100 FL Final    MCH 24.9 (*) 26 - 34 PG Final    MCHC 32.8  32.0 - 36.0 G/DL Final    RDW 09.6 (*) 11 - 15 % Final    Platelet Count 368  150 - 400 K/UL Final    MPV 8.2  7 - 11 FL Final    Neutrophils 68  41 - 77 % Final    Lymphocytes 24  24 - 44 % Final    Monocytes 5  4 - 12 % Final    Eosinophils 2  0 - 5 % Final    Basophils 1  0 - 2 % Final    Absolute Neutrophil  Count 11.47 (*) 1.8 - 7.0 K/UL Final    Absolute Lymph Count 4.00  1.0 - 4.8 K/UL Final    Absolute Monocyte Count 0.83 (*) 0 - 0.80 K/UL Final    Absolute Eosinophil Count 0.33  0 - 0.45 K/UL Final    Absolute Basophil Count 0.19  0 - 0.20 K/UL Final    MDW (Monocyte Distribution Width) 19.6  <20.7 Final   COMPREHENSIVE METABOLIC PANEL - Abnormal    Sodium 138  137 - 147 MMOL/L Final    Potassium 3.4 (*) 3.5 - 5.1 MMOL/L Final    Chloride 100  98 - 110 MMOL/L Final    Glucose 87  70 - 100 MG/DL Final    Blood Urea Nitrogen 3 (*) 7 - 25 MG/DL Final    Creatinine 6.57  0.4 - 1.00 MG/DL Final    Calcium 8.8  8.5 - 10.6 MG/DL Final    Total Protein 7.0  6.0 - 8.0 G/DL Final    Total Bilirubin 0.5  0.3 - 1.2 MG/DL Final    Albumin 3.6  3.5 - 5.0 G/DL Final    Alk Phosphatase 105  25 - 110 U/L Final    AST (SGOT) 18  7 - 40 U/L Final    CO2 25  21 - 30 MMOL/L Final    ALT (SGPT) 6 (*) 7 - 56 U/L Final    Anion Gap 13 (*) 3 - 12 Final    eGFR >60  >60 mL/min Final   NT-PRO-BNP - Abnormal    NT-Pro-BNP 185.0 (*) <125 pg/mL Final   COVID-19 (SARS-COV-2) PCR    COVID-19 (SARS-CoV-2) PCR Source     Corrected    Value: FLOCKED SWAB  NASOPHARYNGEAL      COVID-19 (SARS-CoV-2) PCR NOT DETECTED  DN-NOT DETECTED Final   INFLUENZA A/B AND RSV PCR    Influenza A Virus NEG  NEG-NEG Final    Influenza B Virus NEG  NEG-NEG Final    RSV NEG  NEG-NEG Final   HIGH SENSITIVITY TROPONIN I 0 HOUR    hs Troponin I 0 Hour 4  <12 ng/L Final   HIGH SENSITIVITY TROPONIN I 2 HOUR    hs Troponin I 2 Hour 3  <12 ng/L Final   MAGNESIUM    Magnesium 1.9  1.6 - 2.6 mg/dL Final   POC LACTATE    LACTIC ACID POC 1.6  0.5 - 2.0 MMOL/L Final   HIGH SENSITIVITY TROPONIN I 4 HR   POC URINE PREGNANCY   POC LACTATE   POC LACTATE          Radiology Interpretation:  CHEST SINGLE VIEW   Final Result         Increased linear opacities in the left lower lung zone, likely representing scarring and/or atelectasis.          Finalized by Maida Sale, MD on 12/24/2022 5:51 PM. Dictated by Maida Sale, MD on 12/24/2022 5:49 PM.             EKG:  ECG Results              [facility] MAIN ECG TRIAGE ONLY (Final result)        Collection Time Result Time VT RATE P-R Interval QRS DURATION Q-T Interval QTC Calc Bazett    12/24/22 15:41:21 12/24/22 17:03:46 99 180 148 384 492             Collection Time Result Time P Axis  R Axis T Axis    12/24/22 15:41:21 12/24/22 17:03:46 55 -30 128                     Final result                   Impression:    Normal sinus rhythm  Left axis deviation  Left bundle branch block  Abnormal ECG  When compared with ECG of 03-Jun-2022 17:33,  QRS duration has increased  Nonspecific T wave abnormality no longer evident in Inferior leads  Confirmed by Octavia Bruckner ([id number]) on 12/24/2022 5:03:41 PM Medical Decision Making:  Rayette Mallas is a 37 y.o. female with history as above presenting for complaint of chest pain. IV access was established and patient was placed on telemetry monitoring. Vital signs and physical exam as documented above and  remarkable for tachycardia.    Differential diagnosis at this time includes but is not limited to pneumonia, URI, infection, electrolyte abnormality, ACS, malignancy, CHF.     Plan to obtain CBC, CMP, Magnesium, troponin, BNP, ECG, Covid/flu/RSV, and give tylenol, aspirin.    ED Course:    ED Course as of 12/24/22 2143   Sun Dec 24, 2022   1600 ECG normal sinus rhythm. [CS]   1659 White Blood Cells(!): 16.8 [CS]   1659 Hemoglobin(!): 11.9  No signs of active bleeding. [CS]   1719 Potassium(!): 3.4  Will give supplemental potassium. [CS]   1720 NT-Pro-BNP(!): 185.0  Not significantly elevated compared to prior BMP. [CS]   1720 hs Troponin I 0 Hour: 4  Will send 2-hour troponin [CS]   1757 CHEST SINGLE VIEW  Increased linear opacities in the left lower lung zone, likely representing scarring and/or atelectasis. [CS]   1900 Patient relatively hypotensive, will give IVF and check lactate [CS]   1911 LACTIC ACID POC: 1.6 [CS]   1912 Patient signed out to Dr. Lannette Donath with disposition pending. [CS]      ED Course User Index  [CS] Rolinda Roan, Lollie Sails, MD     On retrospective chart review, patient was discharged with antibiotics for respiratory infection, bacterial versus viral. Patient was discharged in stable condition and to follow-up with PCP.    Overall presentation most consistent with URI.     Complexity of Problems Addressed  Patient's active diagnoses as well as contributing pre-existing medical problems include:  Clinical Impression   Viral URI   Body aches   Bandemia     Evaluation performed for potential threat to life or bodily function during this visit given the initial differential diagnosis and clinical impression(s) as discussed previously in MDM/ED course.    Additional data reviewed:    History was obtained from an independent historian: Family  Prior non-ED notes reviewed: Outside ED record and Documentation from other hospital  Independent interpretation of diagnostic tests was performed by me: Not in addition to what is mentioned above  Patient presentation/management was discussed with the following qualified health care professionals and/or other relevant professionals: Not in addition to what is mentioned above    Risk evaluation:    Diagnosis or treatment of patient condition impacted by social determinant of health: Problems related to health literacy  Tests Considered but not performed due to clinical scoring (if not mentioned in ED course, aside from what is implied by clinical scores listed): none  Rationale regarding whether admission or escalation of care considered if not performed (if not mentioned in ED course, aside  from what is implied by clinical scores listed): n/a    ED Scoring:                                Facility Administered Meds:  Medications   ibuprofen (MOTRIN) tablet 600 mg (600 mg Oral Med Not Given 12/24/22 1904)   acetaminophen (TYLENOL) tablet 650 mg (650 mg Oral Given 12/24/22 1731)   aspirin chewable tablet 324 mg (324 mg Oral Given 12/24/22 1730)   potassium chloride SR (K-DUR) tablet 40 mEq (40 mEq Oral Given 12/24/22 1731)   lactated ringers infusion (0 mL Intravenous Infusion Stopped 12/24/22 1955)       Clinical Impression:  Clinical Impression   Viral URI   Body aches   Bandemia       Disposition/Follow up  ED Disposition     ED Disposition   Discharge           No follow-up provider specified.    Medications:  Discharge Medication List as of 12/24/2022  7:42 PM        START taking these medications    Details   doxycycline hyclate (VIBRACIN) 100 mg tablet Take one tablet by mouth twice daily for 10 days., Disp-20 tablet, R-0, Normal             Procedure Notes:  Procedures       Pat Patrick, MD  PGY-1  [phone]  12/24/2022 9:43 PM

## 2022-12-25 NOTE — ED Notes
Pt. Appropriate for DC at this time. Pt given AVS and verbalized understanding of instructions with no further questions. Pt educated on follow up care and agreeable to plan of care. Pt ambulates out of department with even and steady gait and all belongings.

## 2022-12-25 NOTE — Unmapped
You are seen evaluated in the emergency department today.  Labs were obtained which normal cardiac function at this time did not show anything of concern.  We believe your symptoms are most consistent with a viral upper respiratory infection but we will send you home with antibiotics out of abundance of caution to prevent any bacterial infection or pneumonia.     Prescription of doxycycline has been sent to your preferred pharmacy to be taken for the next 10 days.    Return to emergency department if you develop any severe shortness of breath, unable to breathe, have severe chest pain with diaphoresis or you feel like you need to be reevaluated by the emergency department.

## 2022-12-28 ENCOUNTER — Encounter: Admit: 2022-12-28 | Discharge: 2022-12-28 | Payer: MEDICAID | Primary: Family

## 2023-01-27 ENCOUNTER — Encounter: Admit: 2023-01-27 | Discharge: 2023-01-27 | Payer: MEDICAID | Primary: Family

## 2023-01-27 ENCOUNTER — Emergency Department: Admit: 2023-01-27 | Discharge: 2023-01-27 | Payer: MEDICAID

## 2023-01-27 ENCOUNTER — Observation Stay: Admit: 2023-01-27 | Discharge: 2023-01-27 | Payer: MEDICAID | Primary: Family

## 2023-01-27 DIAGNOSIS — R0600 Dyspnea, unspecified: Secondary | ICD-10-CM

## 2023-01-27 DIAGNOSIS — R000 Tachycardia, unspecified: Secondary | ICD-10-CM

## 2023-01-27 DIAGNOSIS — G894 Chronic pain syndrome: Secondary | ICD-10-CM

## 2023-01-27 LAB — HIGH SENSITIVITY TROPONIN I, RANDOM
HIGH SENSITIVITY TROPONIN I: 3 ng/L (ref <12)
HIGH SENSITIVITY TROPONIN I: 4 ng/L (ref <12)

## 2023-01-27 LAB — CREATINE KINASE-CPK: CK TOTAL: 25 U/L (ref 21–215)

## 2023-01-27 LAB — URINALYSIS DIPSTICK REFLEX TO CULTURE
NITRITE: NEGATIVE
URINE ASCORBIC ACID, UA: NEGATIVE
URINE BILE: NEGATIVE
URINE BLOOD: NEGATIVE
URINE PH: 6 (ref 5.0–8.0)

## 2023-01-27 LAB — HIGH SENSITIVITY TROPONIN I 0 HOUR: HIGH SENSITIVITY TROPONIN I 0 HOUR: 4 ng/L (ref <12)

## 2023-01-27 LAB — CBC AND DIFF
ABSOLUTE BASO COUNT: 0.1 K/UL (ref 0–0.20)
ABSOLUTE EOS COUNT: 0 K/UL (ref 0–0.45)
ABSOLUTE LYMPH COUNT: 1.7 K/UL (ref >60)
ABSOLUTE MONO COUNT: 0.9 K/UL — ABNORMAL HIGH (ref 0–0.80)
EOSINOPHILS %: 0 % (ref 0–5)
HEMATOCRIT: 34 % — ABNORMAL LOW (ref 36–45)
MCH: 24 pg — ABNORMAL LOW (ref 26–34)
MDW (MONOCYTE DISTRIBUTION WIDTH): 22 — ABNORMAL HIGH (ref <20.7)
NEUTROPHILS %: 84 % — ABNORMAL HIGH (ref 41–77)
PLATELET COUNT: 342 K/UL (ref 150–400)
RDW: 16 % — ABNORMAL HIGH (ref 11–15)
WBC COUNT: 17 K/UL — ABNORMAL HIGH (ref 4.5–11.0)

## 2023-01-27 LAB — COMPREHENSIVE METABOLIC PANEL
ALK PHOSPHATASE: 132 U/L — ABNORMAL HIGH (ref 25–110)
ALT: 10 U/L (ref 7–56)
ANION GAP: 13 K/UL — ABNORMAL HIGH (ref 3–12)
AST: 16 U/L (ref 7–40)
CREATININE: 0.7 mg/dL (ref 0.4–1.00)
GLUCOSE,PANEL: 107 mg/dL — ABNORMAL HIGH (ref 70–100)
SODIUM: 135 MMOL/L — ABNORMAL LOW (ref 137–147)
TOTAL BILIRUBIN: 0.6 mg/dL (ref 0.3–1.2)

## 2023-01-27 LAB — LACTIC ACID (BG - RAPID LACTATE): LACTIC ACID(SYRINGE): 0.8 MMOL/L (ref 0.5–2.0)

## 2023-01-27 LAB — VITAMIN B12: VITAMIN B12: 257 pg/mL (ref 180–914)

## 2023-01-27 LAB — POC GLUCOSE
POC GLUCOSE: 76 mg/dL (ref 70–100)
POC GLUCOSE: 95 mg/dL (ref 70–100)

## 2023-01-27 LAB — TSH WITH FREE T4 REFLEX: TSH: 1.2 uU/mL (ref 0.35–5.00)

## 2023-01-27 LAB — URINALYSIS MICROSCOPIC REFLEX TO CULTURE

## 2023-01-27 LAB — SED RATE: ESR: 121 mm/h — ABNORMAL HIGH (ref >60)

## 2023-01-27 LAB — MAGNESIUM: MAGNESIUM: 2 mg/dL — ABNORMAL LOW (ref 1.6–2.6)

## 2023-01-27 LAB — PROCALCITONIN: PROCALCITONIN: 0 ng/mL (ref 7–40)

## 2023-01-27 MED ORDER — ACETAMINOPHEN 500 MG PO TAB
1000 mg | ORAL | 0 refills | Status: AC
Start: 2023-01-27 — End: ?
  Administered 2023-01-28 – 2023-01-30 (×11): 1000 mg via ORAL

## 2023-01-27 MED ORDER — SODIUM CHLORIDE 0.9 % IV SOLP
INTRAVENOUS | 0 refills | Status: CP
Start: 2023-01-27 — End: ?
  Administered 2023-01-28: 03:00:00 1000.000 mL via INTRAVENOUS

## 2023-01-27 MED ORDER — MORPHINE 4 MG/ML IV SYRG
4 mg | Freq: Once | INTRAVENOUS | 0 refills | Status: CP
Start: 2023-01-27 — End: ?
  Administered 2023-01-27: 19:00:00 4 mg via INTRAVENOUS

## 2023-01-27 MED ORDER — FENTANYL CITRATE (PF) 50 MCG/ML IJ SOLN
50 ug | Freq: Once | INTRAVENOUS | 0 refills | Status: DC
Start: 2023-01-27 — End: 2023-01-27

## 2023-01-27 MED ORDER — OXYCODONE 10 MG PO TAB
10 mg | ORAL | 0 refills | Status: AC | PRN
Start: 2023-01-27 — End: ?
  Administered 2023-01-28: 02:00:00 10 mg via ORAL

## 2023-01-27 MED ORDER — ENOXAPARIN 40 MG/0.4 ML SC SYRG
40 mg | Freq: Every day | SUBCUTANEOUS | 0 refills | Status: AC
Start: 2023-01-27 — End: ?
  Administered 2023-01-28 – 2023-01-30 (×3): 40 mg via SUBCUTANEOUS

## 2023-01-27 MED ORDER — FENTANYL CITRATE (PF) 50 MCG/ML IJ SOLN
25 ug | INTRAVENOUS | 0 refills | Status: AC | PRN
Start: 2023-01-27 — End: ?
  Administered 2023-01-28: 01:00:00 25 ug via INTRAVENOUS

## 2023-01-27 MED ORDER — POLYETHYLENE GLYCOL 3350 17 GRAM PO PWPK
1 | Freq: Every day | ORAL | 0 refills | Status: AC | PRN
Start: 2023-01-27 — End: ?

## 2023-01-27 MED ORDER — PANTOPRAZOLE 40 MG PO TBEC
40 mg | Freq: Every day | ORAL | 0 refills | Status: DC
Start: 2023-01-27 — End: 2023-01-28

## 2023-01-27 MED ORDER — CLOPIDOGREL 75 MG PO TAB
75 mg | Freq: Every day | ORAL | 0 refills | Status: AC
Start: 2023-01-27 — End: ?
  Administered 2023-01-28 – 2023-01-29 (×2): 75 mg via ORAL

## 2023-01-27 MED ORDER — ALPRAZOLAM 0.5 MG PO TAB
.5 mg | Freq: Two times a day (BID) | ORAL | 0 refills | Status: AC | PRN
Start: 2023-01-27 — End: ?
  Administered 2023-01-28 – 2023-01-30 (×5): 0.5 mg via ORAL

## 2023-01-27 MED ORDER — MELATONIN 5 MG PO TAB
5 mg | Freq: Every evening | ORAL | 0 refills | Status: AC | PRN
Start: 2023-01-27 — End: ?
  Administered 2023-01-30: 03:00:00 5 mg via ORAL

## 2023-01-27 MED ORDER — ONDANSETRON HCL (PF) 4 MG/2 ML IJ SOLN
4 mg | Freq: Once | INTRAVENOUS | 0 refills | Status: CP
Start: 2023-01-27 — End: ?
  Administered 2023-01-27: 19:00:00 4 mg via INTRAVENOUS

## 2023-01-27 MED ORDER — OXYCODONE 10 MG PO TAB
10 mg | Freq: Once | ORAL | 0 refills | Status: CP
Start: 2023-01-27 — End: ?
  Administered 2023-01-27: 22:00:00 10 mg via ORAL

## 2023-01-27 MED ORDER — PANTOPRAZOLE 40 MG PO TBEC
40 mg | Freq: Every day | ORAL | 0 refills | Status: AC
Start: 2023-01-27 — End: ?
  Administered 2023-01-29 – 2023-01-30 (×2): 40 mg via ORAL

## 2023-01-27 MED ORDER — CYCLOBENZAPRINE 10 MG PO TAB
10 mg | Freq: Three times a day (TID) | ORAL | 0 refills | Status: AC | PRN
Start: 2023-01-27 — End: ?
  Administered 2023-01-28: 02:00:00 10 mg via ORAL

## 2023-01-27 MED ORDER — SENNOSIDES-DOCUSATE SODIUM 8.6-50 MG PO TAB
1 | Freq: Every day | ORAL | 0 refills | Status: AC | PRN
Start: 2023-01-27 — End: ?

## 2023-01-27 MED ORDER — KETOROLAC 15 MG/ML IJ SOLN
15 mg | Freq: Once | INTRAVENOUS | 0 refills | Status: CP
Start: 2023-01-27 — End: ?
  Administered 2023-01-27: 21:00:00 15 mg via INTRAVENOUS

## 2023-01-27 MED ORDER — METOPROLOL SUCCINATE 50 MG PO TB24
50 mg | Freq: Two times a day (BID) | ORAL | 0 refills | Status: AC
Start: 2023-01-27 — End: ?

## 2023-01-27 MED ORDER — LACTATED RINGERS IV SOLP
1000 mL | INTRAVENOUS | 0 refills | Status: CP
Start: 2023-01-27 — End: ?
  Administered 2023-01-27: 19:00:00 1000 mL via INTRAVENOUS

## 2023-01-27 NOTE — ED Provider Notes
Rhonda Prince is a 37 y.o. female.    Chief Complaint:  Chief Complaint   Patient presents with    Shortness of Breath     Seen at [facility], SoA x4 days       History of Present Illness:  Patient is a 37 year old female with a history of coronary artery disease, chronic pain, ovarian cyst, coronary stent placement, appendectomy, and hysterectomy who presents to the emergency department for shortness of breath.  She states she has had recent evaluation at [facility] for the same complaint but left AMA because they were not managing her pain appropriately.  She states they thought maybe she was having some congestive heart failure which she denies any history of, but ultimately cardiology decided against this.  Review of the chart shows a tachycardia that is persistent (baseline 100's per patient) as well as a persistent leukocytosis.  They attempted to diurese her with Lasix and did not get any improvement of her symptoms.  Ultimately because she was not receiving her chronic pain medication she left AMA.  She came over here for reevaluation.  She denies any cough, chest pain, fever, chills, nausea, vomiting.  States she is just having a flare of her chronic pain and is feeling short of breath.        Review of Systems:  Review of Systems   Constitutional: Negative.    HENT: Negative.     Eyes: Negative.    Respiratory:  Positive for shortness of breath.    Cardiovascular: Negative.    Gastrointestinal: Negative.    Genitourinary: Negative.    Musculoskeletal:  Positive for arthralgias and back pain.   Skin: Negative.    Neurological: Negative.    Hematological: Negative.    Psychiatric/Behavioral: Negative.         Allergies:  Pcn [penicillins] and Jardiance [empagliflozin]    Past Medical History:  Medical History:   Diagnosis Date    Chronic back pain     Coronary artery disease     Ovarian cyst        Past Surgical History:  Surgical History:   Procedure Laterality Date    HX HEART CATHETERIZATION  02/11/2022    Left heart catheterization with PCI to LAD    HX APPENDECTOMY      HX HYSTERECTOMY         Pertinent medical/surgical history reviewed  Medical History:   Diagnosis Date    Chronic back pain     Coronary artery disease     Ovarian cyst      Surgical History:   Procedure Laterality Date    HX HEART CATHETERIZATION  02/11/2022    Left heart catheterization with PCI to LAD    HX APPENDECTOMY      HX HYSTERECTOMY         Social History:  Social History     Tobacco Use    Smoking status: Every Day     Current packs/day: 1.50     Average packs/day: 1.5 packs/day for 11.0 years (16.5 ttl pk-yrs)     Types: Cigarettes    Smokeless tobacco: Never   Vaping Use    Vaping status: Never Used   Substance Use Topics    Alcohol use: Not Currently    Drug use: No     Social History     Substance and Sexual Activity   Drug Use No  Family History:  No family history on file.    Vitals:  ED Vitals      Date and Time T BP P RR SPO2P SPO2 User   01/27/23 1608 -- 100/76 121 21 PER MINUTE -- -- NH   01/27/23 1602 -- 89/63 126 18 PER MINUTE -- -- NH   01/27/23 1500 -- 90/68 131 24 PER MINUTE -- -- NH   01/27/23 1424 -- 91/62 130 27 PER MINUTE -- 95 % NH   01/27/23 1330 -- 95/68 143 23 PER MINUTE -- 98 % NH   01/27/23 1306 36.2 ?C (97.2 ?F) 100/64 142 13 PER MINUTE -- 99 % NH            Physical Exam:  Physical Exam  Vitals and nursing note reviewed.   Constitutional:       Appearance: Normal appearance.   HENT:      Head: Normocephalic.      Nose: Nose normal.      Mouth/Throat:      Mouth: Mucous membranes are moist.   Eyes:      Pupils: Pupils are equal, round, and reactive to light.   Cardiovascular:      Rate and Rhythm: Regular rhythm. Tachycardia present.      Pulses: Normal pulses.      Heart sounds: Normal heart sounds.   Pulmonary:      Effort: Pulmonary effort is normal.      Breath sounds: Normal breath sounds.   Abdominal:      General: Abdomen is flat.      Palpations: Abdomen is soft. Tenderness: There is no abdominal tenderness.   Musculoskeletal:         General: Normal range of motion.      Cervical back: Normal range of motion and neck supple.   Skin:     General: Skin is warm and dry.      Capillary Refill: Capillary refill takes less than 2 seconds.   Neurological:      General: No focal deficit present.      Mental Status: She is alert and oriented to person, place, and time.   Psychiatric:         Mood and Affect: Mood normal.         Behavior: Behavior normal.         Laboratory Results:  Labs Reviewed   CBC AND DIFF - Abnormal       Result Value Ref Range Status    White Blood Cells 17.9 (*) 4.5 - 11.0 K/UL Final    RBC 4.56  4.0 - 5.0 M/UL Final    Hemoglobin 11.1 (*) 12.0 - 15.0 GM/DL Final    Hematocrit 16.1 (*) 36 - 45 % Final    MCV 75.3 (*) 80 - 100 FL Final    MCH 24.3 (*) 26 - 34 PG Final    MCHC 32.3  32.0 - 36.0 G/DL Final    RDW 09.6 (*) 11 - 15 % Final    Platelet Count 342  150 - 400 K/UL Final    MPV 8.4  7 - 11 FL Final    Neutrophils 84 (*) 41 - 77 % Final    Lymphocytes 10 (*) 24 - 44 % Final    Monocytes 5  4 - 12 % Final    Eosinophils 0  0 - 5 % Final    Basophils 1  0 - 2 % Final    Absolute Neutrophil  Count 15.04 (*) 1.8 - 7.0 K/UL Final    Absolute Lymph Count 1.74  1.0 - 4.8 K/UL Final    Absolute Monocyte Count 0.94 (*) 0 - 0.80 K/UL Final    Absolute Eosinophil Count 0.07  0 - 0.45 K/UL Final    Absolute Basophil Count 0.12  0 - 0.20 K/UL Final    MDW (Monocyte Distribution Width) 22.8 (*) <20.7 Final   COMPREHENSIVE METABOLIC PANEL - Abnormal    Sodium 135 (*) 137 - 147 MMOL/L Final    Potassium 3.6  3.5 - 5.1 MMOL/L Final    Chloride 99  98 - 110 MMOL/L Final    Glucose 107 (*) 70 - 100 MG/DL Final    Blood Urea Nitrogen 10  7 - 25 MG/DL Final    Creatinine 1.91  0.4 - 1.00 MG/DL Final    Calcium 8.9  8.5 - 10.6 MG/DL Final    Total Protein 7.0  6.0 - 8.0 G/DL Final    Total Bilirubin 0.6  0.3 - 1.2 MG/DL Final    Albumin 3.5  3.5 - 5.0 G/DL Final    Alk Phosphatase 132 (*) 25 - 110 U/L Final    AST (SGOT) 16  7 - 40 U/L Final    CO2 23  21 - 30 MMOL/L Final    ALT (SGPT) 10  7 - 56 U/L Final    Anion Gap 13 (*) 3 - 12 Final    eGFR >60  >60 mL/min Final   NT-PRO-BNP - Abnormal    NT-Pro-BNP 290.0 (*) <125 pg/mL Final   URINALYSIS DIPSTICK REFLEX TO CULTURE - Abnormal    Color,UA YELLOW   Final    Turbidity,UA CLEAR  CLEAR-CLEAR Final    Specific Gravity-Urine 1.012  1.005 - 1.030 Final    pH,UA 6.0  5.0 - 8.0 Final    Protein,UA NEG  NEG-NEG Final    Glucose,UA NEG  NEG-NEG Final    Ketones,UA NEG  NEG-NEG Final    Bilirubin,UA NEG  NEG-NEG Final    Blood,UA NEG  NEG-NEG Final    Urobilinogen,UA INCREASED (*) NORM-NORMAL Final    Nitrite,UA NEG  NEG-NEG Final    Leukocytes,UA TRACE (*) NEG-NEG Final    Urine Ascorbic Acid, UA NEG  NEG-NEG Final   HIGH SENSITIVITY TROPONIN I 0 HOUR    hs Troponin I 0 Hour 4  <12 ng/L Final   MAGNESIUM    Magnesium 2.0  1.6 - 2.6 mg/dL Final   URINALYSIS MICROSCOPIC REFLEX TO CULTURE    WBCs,UA 2-10  0 - 2 /HPF Final    RBCs,UA 0-2  0 - 3 /HPF Final    Comment,UA     Final    Value: Criteria for reflex to culture are WBC>10, Positive Nitrite, and/or >=+1   leukocytes. If quantity is not sufficient, an addendum will follow.      UA Reflex Specimen Type and Source     Final    Value: URINE  MIDSTREAM      MucousUA TRACE   Final    Squamous Epithelial Cells 0-2  0 - 5 Final   HIGH SENSITIVITY TROPONIN I, RANDOM    hs Troponin I, Random 3  <12 ng/L Final   HIGH SENSITIVITY TROPONIN I 4 HR   UA GREY TOP TUBE   CLEAR TOP EXTRA URINE TUBE   POC BNP          Radiology Interpretation:  CHEST SINGLE VIEW  Final Result         No acute findings.          Finalized by Micki Riley, MD on 01/27/2023 2:33 PM. Dictated by Micki Riley, MD on 01/27/2023 2:32 PM.             EKG:  ECG Results              [facility] ED MAIN ECG TRIAGE ONLY (In process)        Collection Time Result Time VT RATE P-R Interval QRS DURATION Q-T Interval QTC Calc Bazett 01/27/23 12:56:53 01/27/23 12:57:18 145 152 134 294 456             Collection Time Result Time P Axis R Axis T Axis    01/27/23 12:56:53 01/27/23 12:57:18 3 -32 126                     In process                   Impression:    Sinus tachycardia  Left axis deviation  Nonspecific intraventricular block  Minimal voltage criteria for LVH, may be normal variant ( Cornell product )  T wave abnormality, consider lateral ischemia  Abnormal ECG  When compared with ECG of 24-Dec-2022 15:41,  No significant change was found                                    Medical Decision Making:  Rhonda Prince is a 37 y.o. female who presents with chief complaint as listed above. Based on the history and presentation, the list of differential diagnoses considered included, but was not limited to, ACS, fluid overload, pleural effusion, UTI, dehydration, electrolyte abnormality.    ED Course  Patient's chest x-ray is clear and unremarkable.  Her vital signs reveal a tachycardia that appears to be only slightly above her baseline of the 100s to 110s.  She chronically takes oxycodone so we will give her a dose of IV pain medicine and some Toradol to try and get ahead of her chronic pain.  Will check basic labs as well as urinalysis.  EKG is demonstrating tachycardia without any significant changes compared to her prior.  Patient already had a slightly elevated D-dimer a few days ago and had a CT of her chest performed which did not show any PEs.  She was also evaluated by cardiology and determined not to have congestive heart failure.  Do not feel any need for rechecking a D-dimer at this point.    Labs are reassuring with persistent leukocytosis, only mildly elevated from previous labs.  She does not appear acutely dehydrated and shows no electrolyte abnormalities.  Will give some fluids to see if this helps her tachycardia and improves her blood pressure which is slightly soft, in light of the fact that she recently received Lasix at her previous hospitalization.  Patient's pain slightly improved after Toradol and morphine but not controlled.  Home oxycodone ordered as well.  Discussed with her that given her soft blood pressures I do not feel comfortable giving her more morphine at this time and would like to stick to medications that we will not affect her blood pressure as much.    Urinalysis pending at time of admission but no suspicion for infection at this time.  She does technically meet SIRS criteria however without source  or symptoms of infection do not feel it is appropriate to antibiosis at this point.  Somewhat unclear cause of patient's dyspnea and acute worsening of her chronic pain at this time.  Patient's pain is still poorly controlled.  Will arrange admission for observation given her chronic pain, persistent tachycardia and softer blood pressures.  Patient comfortable with plan.       Complexity of Problems Addressed  Patient's active diagnoses as well as contributing pre-existing medical problems include:  Clinical Impression   Dyspnea, unspecified type   Chronic pain syndrome   Tachycardia          Additional data reviewed:    History was obtained from an independent historian: Not in addition to what is mentioned above  Prior non-ED notes reviewed: Not in addition to what is mentioned above  Independent interpretation of diagnostic tests was performed by me: Not in addition to what is mentioned above  Patient presentation/management was discussed with the following qualified health care professionals and/or other relevant professionals: Not in addition to what is mentioned above    Risk evaluation:    Diagnosis or treatment of patient condition impacted by social determinant of health: None  Tests Considered but not performed due to clinical scoring (if not mentioned in ED course, aside from what is implied by clinical scores listed):   Rationale regarding whether admission or escalation of care considered if not performed (if not mentioned in ED course, aside from what is implied by clinical scores listed):     ED Scoring:                                Facility Administered Meds:  Medications   lactated ringers infusion (1,000 mL Intravenous Given - New Bag 01/27/23 1354)   morphine injection 4 mg (4 mg Intravenous Given 01/27/23 1354)   ondansetron HCL (PF) (ZOFRAN (PF)) injection 4 mg (4 mg Intravenous Given 01/27/23 1353)   ketorolac (TORADOL) injection 15 mg (15 mg Intravenous Given 01/27/23 1602)   oxyCODONE tablet 10 mg (10 mg Oral Given 01/27/23 1637)       Clinical Impression:  Clinical Impression   Dyspnea, unspecified type   Chronic pain syndrome   Tachycardia       Disposition/Follow up  ED Disposition     ED Disposition   Admit           No follow-up provider specified.    Medications:  New Prescriptions    No medications on file       Procedure Notes:  Procedures       Attestation / Supervision:  I personally performed the E/M including history, physical exam, and MDM. and I, Hinda Kehr, APRN-NP, personally performed the services described in this documentation as scribed in my presence and it is both accurate and complete.      Hinda Kehr, APRN-NP

## 2023-01-27 NOTE — ED Notes
RN gave report to [id number] RN

## 2023-01-27 NOTE — ED Notes
Pt states they are unable to urinate at this time. 

## 2023-01-27 NOTE — ED Notes
37 yo female presents to ED with CC of SoA. Pt reports overnight admission on 05/10 to 05/11 at [facility]. Pt states they left AMA vs [facility] d/t feeling as if they were being lied to by physicians. Pt endorses full body pain from ..my back to my legs. Pt denies fevers, cough, CP, HA, myalgias, arthralgias, malaise, otalgia, nasal / sinus / chest congestion, or dysuria. Pt denies Hx of heart failure. Pt endorses decreased activity tolerance.     PMH of     Medical History:   Diagnosis Date    Chronic back pain     Coronary artery disease     Ovarian cyst          Pt A&O x 4, connected to monitor, VSS, patent airway, breathing tachypneic but non-labored, and pulses palpable. RN notes bed is in lowest position, side rails up, and call light within reach. Provider at bedside.

## 2023-01-27 NOTE — H&P (View-Only)
Admission History and Physical Examination      Name: Rhonda Prince   MRN: [id number]     DOB: [birth year]      Age: 37 y.o.  Admission Date: 01/27/2023     LOS: 0 days     Date of Service: 01/27/2023                        Assessment/Plan:    Active Problems:    * No active hospital problems. *    Rhonda Prince is a 37 y.o. female with PMHx of coronary artery disease s/p CABG and PCIs, chronic pain, ovarian cyst, chronic leukocytosis presenting with reports of dyspnea  and acute on chronic diffuse pains and found to have sinus tachycardia, ED requesting admit for further workup.     Dyspnea  Sinus Tachycardia   Acute on chronic diffuse pain  -Patient reports increased dyspnea that she was admitted to [facility] for on 5 10-5/11   Orders believe she was hypervolemic.  She left AMA however due to reportedly not receiving PTA pain medications.  She also has reported chronic tachycardia in the low 100s however was 160s on admission.  She also reports diffuse sharp pain stemming from her neck that radiates down her body.  She reports this is different than her chronic pain which is primarily thoracolumbar with radiculopathy.  She denies any improvement or symptoms diuresis with Lasix.   ED requesting admission due to persistent tachycardia and ongoing pain with unclear etiology.   - Overall unclear cause of her pain though likely worsening of her chronic pains.   - EKG with sinus tachycardia, similar on telemetry with HRs to 160s on admit improved to 120s with IVF.   - CXR without acute findings   - hsTrop x 2 negative  - proBNP 290  - TTE 01/01/2023   - CTA chest 5/10at OSH with no PE   - BMBx 01/10/2022 - obtained for continued chronic leukocytosis. 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 Definite morphologic features of a chronic myeloproliferative neoplasm are not identified. There is markedly decreased to absent marrow iron stores noted. The microcytic anemia may be secondary to an iron deficiency anemia. Thrombocytosis may be seen in patients with chronic iron deficiency anemia. The leukocytosis is favored to represent a reactive neutrophilia. Correlation with clinical findings and cytogenetic studies is recommended. JAK2 mutation by molecular studies and BCR/ABL gene fusion by [facility] studies are pending to definitely exclude or rule in the possibility of a clonal process. An addendum report will be submitted separately.   PLAN  > Admit to telemetry  > Hold diuretics with borderline BP   > Hold on repeat TTE pending tachycardia trend (already improving with small dose of IVF in ED)  > CT C spine with her diffuse pain originating in her neck   > CPK, B12, TSH, AM cortisol, ESR, CRP   > Continue PTA oxycodone 10mg  but increase to q4h for now, add fentanyl q4h for now while awaiting initial workup only as pain is also likely contributing to significant tachycardia on admit. Of note, morphine made patient hypotensive in ED. We discussed not escalating from her chronic pain regimen more than this and she was in agreement.   > Continue PTA flexeril, scheduled APAP for adjuncts. Declines gabapentin or Lyrica  which she says has not helped her in the past despite counseling on new pain character.     CAD s/p CABG  Chronic HFrEF  - Appears compensated on admit, admitted 5/10-11 where patient was felt to have hypervolemia and was diuresed.  Left AMA due to reportedly not receiving PTA pain medications.  - TTE 01/01/2023   - Reports her primary cardiologist recently stopped rosuvastatin and entresto  PLAN  > Continue PTA metoprolol XL   > Continue PTA plavix (reports now not on DAPT)  > Hold PTA torsemide  > Standing daily weights     Chronic back and myosascial pains   S/p multiple lumbar back surgeries   - Follows with outside pain specialist at [facility], Dr. Felipa Furnace  - PTA oxycodone 10mg  Q6h with flexeril    FEN: Cardiac diet, no IVF, replete lytes PRN  DVT PPX: lovenox ppx  CODE: FULL, confirmed on admit  DISPO: admit to medicine    Bayard Hugger, MD  Hospitalist   Available on [place], [place]     Rhonda Prince is the preferred method of communication.   Please use the Med Private First Call for all patient-related communications. Personal Voaltes and pagers are not answered at all hours.    _____________________________________________________________________________    Primary Care Physician: Sharene Butters  Verified    Chief Complaint:  dyspnea, acute on chronic pain  History of Present Illness: Rhonda Prince is a 37 y.o. female with PMHx of coronary artery disease s/p CABG and PCIs, chronic pain, ovarian cyst, chronic leukocytosis presenting with reports of dyspnea  and acute on chronic diffuse pains and found to have sinus tachycardia, ED requesting admit for further workup.  History provided by patient and friend at bedside.  They report that she has been having worsening dyspnea lately.  Unable to quantify time.  She reports that this is coincided with worsening of diffuse pains.  She reports that these pains are sharp and start in her neck and radiate all down her body to her toes.  Movement and palpation worsens these pains.  Really only found that pain meds improve these pains.  Specifically also she has not found that gabapentin or Lyrica has helped with his pain.  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  Patient was also of note admitted 5/10-5/11 for similar issues at [facility].  She reported leaving AMA due to not receiving her chronic pain meds  and was felt to be inadequate pain treatment.  We discussed plan of care as above and patient was in agreement with this plan at the time.  Patient noted to have significant sinus tachycardia on admission  to 160s that improved to her basleine 100s during our interview.  She denies any fever, chills, nausea, diarrhea, chest pain, worsening lower extreme edema, orthopnea or PND.        Medical History:   Diagnosis Date    Chronic back pain     Coronary artery disease     Ovarian cyst      Surgical History:   Procedure Laterality Date    HX HEART CATHETERIZATION  02/11/2022    Left heart catheterization with PCI to LAD    HX APPENDECTOMY      HX HYSTERECTOMY       Family history reviewed; non-contributory  Social History  Tobacco Use    Smoking status: Every Day     Current packs/day: 1.50     Average packs/day: 1.5 packs/day for 11.0 years (16.5 ttl pk-yrs)     Types: Cigarettes    Smokeless tobacco: Never   Vaping Use    Vaping status: Never Used   Substance and Sexual Activity    Alcohol use: Not Currently    Drug use: No             Immunizations (includes history and patient reported):   Immunization History   Administered Date(s) Administered    Flu Vaccine =>6 Months Quadrivalent PF 07/12/2020    Pneumococcal Vaccine (23-Val Adult) 01/30/2012           Allergies:  Pcn [penicillins] and Jardiance [empagliflozin]    Medications:  Medications Prior to Admission   Medication Sig    ALPRAZolam (XANAX) 0.5 mg tablet Take one tablet by mouth twice daily as needed for Anxiety.    aspirin EC 81 mg tablet Take one tablet by mouth daily. Take with food.    cyclobenzaprine (FLEXERIL) 10 mg tablet Take one tablet by mouth three times daily as needed.    ibuprofen (MOTRIN) 800 mg tablet Take one tablet by mouth three times daily.    metoprolol succinate XL (TOPROL XL) 100 mg extended release tablet Take one tablet by mouth daily.    omeprazole DR(+) (PRILOSEC) 20 mg capsule Take one capsule by mouth daily before breakfast.    oxyCODONE 10 mg tablet Take one tablet by mouth three times daily.    prasugreL (EFFIENT) 10 mg tablet Take one tablet by mouth daily.    rosuvastatin (CRESTOR) 10 mg tablet Take one tablet by mouth at bedtime daily.    spironolactone (ALDACTONE) 25 mg tablet Take one tablet by mouth daily. Take with food.    venlafaxine XR (EFFEXOR XR) 150 mg capsule Take one capsule by mouth daily. Take with food.     Review of Systems:  All other systems reviewed and are negative.    Physical Exam:  Vital Signs: Last Filed In 24 Hours Vital Signs: 24 Hour Range   BP: 100/76 (05/11 1608)  Temp: 36.2 ?C (97.2 ?F) (05/11 1306)  Pulse: 121 (05/11 1608)  Respirations: 21 PER MINUTE (05/11 1608)  SpO2: 95 % (05/11 1424)  O2 Device: None (Room air) (05/11 1306)  Height: 167.6 cm (5' 6) (05/11 1306) BP: (89-100)/(62-76)   Temp:  [36.2 ?C (97.2 ?F)]   Pulse:  [121-143]   Respirations:  [13 PER MINUTE-27 PER MINUTE]   SpO2:  [95 %-99 %]   O2 Device: None (Room air)   Intensity Pain Scale (Self Report): 10 (01/27/23 1627)      General appearance: alert, cooperative, intermittently uncomfortable appearing though this alternates with no appearance of pain.   Head: Normocephalic, without obvious abnormality, atraumatic  Eyes: conjunctivae/corneas clear. PERRL, EOM's intact.  Neck: supple, symmetrical, trachea midline, no adenopathy, no JVD  Back: symmetric, no curvature. ROM normal.    Lungs: clear to auscultation bilaterally   Heart: regular tachycardia, S1, S2 normal, no murmur  Abdomen: soft, non-tender. Bowel sounds normal.   Extremities: extremities normal, atraumatic, no cyanosis or edema  Neurologic: Grossly normal, Alert and oriented X 3, no gross focal deficits   Skin: Skin color normal, Warm and Dry.  No rashes or lesions       Lab/Radiology/Other Diagnostic Tests:  Pertinent labs reviewed  Glucose: (!) 107 (01/27/23 1313)  Pertinent radiology reviewed.  Kerney Elbe, MD

## 2023-01-28 ENCOUNTER — Observation Stay: Admit: 2023-01-28 | Discharge: 2023-01-28 | Payer: MEDICAID | Primary: Family

## 2023-01-28 ENCOUNTER — Encounter: Admit: 2023-01-28 | Discharge: 2023-01-28 | Payer: MEDICAID | Primary: Family

## 2023-01-28 MED ADMIN — FENTANYL CITRATE (PF) 50 MCG/ML IJ SOLN [3037]: 25 ug | INTRAVENOUS | @ 20:00:00 | NDC 00409909412

## 2023-01-28 MED ADMIN — NOREPINEPHRINE BITARTRATE-NACL 4 MG/250 ML (16 MCG/ML) IV SOLN [173409]: 0.03 ug/kg/min | INTRAVENOUS | @ 07:00:00 | NDC 44567064001

## 2023-01-28 MED ADMIN — COSYNTROPIN 0.25 MG IJ SOLR [78840]: 0.25 mg | INTRAVENOUS | @ 15:00:00 | Stop: 2023-01-28 | NDC 00781344071

## 2023-01-28 MED ADMIN — FENTANYL CITRATE (PF) 50 MCG/ML IJ SOLN [3037]: 25 ug | INTRAVENOUS | @ 15:00:00 | NDC 00409909412

## 2023-01-28 MED ADMIN — FENTANYL CITRATE (PF) 50 MCG/ML IJ SOLN [3037]: 25 ug | INTRAVENOUS | @ 18:00:00 | NDC 00409909412

## 2023-01-28 MED ADMIN — LACTATED RINGERS IV SOLP [4318]: 500 mL | INTRAVENOUS | @ 08:00:00 | Stop: 2023-01-28 | NDC 00338011704

## 2023-01-28 MED ADMIN — POTASSIUM CHLORIDE 20 MEQ PO TBTQ [35943]: 60 meq | ORAL | @ 11:00:00 | Stop: 2023-01-28 | NDC 00832532510

## 2023-01-28 MED ADMIN — OXYCODONE 10 MG PO TAB [166908]: 10 mg | ORAL | @ 14:00:00 | NDC 68084096811

## 2023-01-28 MED ADMIN — FENTANYL CITRATE (PF) 50 MCG/ML IJ SOLN [3037]: 25 ug | INTRAVENOUS | @ 22:00:00 | NDC 00409909412

## 2023-01-28 MED ADMIN — MAGNESIUM SULFATE IN D5W 1 GRAM/100 ML IV PGBK [166578]: 1 g | INTRAVENOUS | @ 11:00:00 | Stop: 2023-01-28 | NDC 00264440054

## 2023-01-28 MED ADMIN — OXYCODONE 10 MG PO TAB [166908]: 10 mg | ORAL | @ 20:00:00 | NDC 68084096811

## 2023-01-28 MED ADMIN — SODIUM CHLORIDE 0.9 % IV SOLP [27838]: INTRAVENOUS | @ 11:00:00 | NDC 00338004902

## 2023-01-28 NOTE — Progress Notes
RT Adult Assessment Note    NAME:Rhonda Prince             MRN: [id number]             DOB:1986-01-13          AGE: 37 y.o.  ADMISSION DATE: 01/27/2023             DAYS ADMITTED: LOS: 0 days    Additional Comments:  Impressions of the patient: NAD, pt resting comfortably in bed on RA.  Intervention(s)/outcome(s): RT Evaluation.  Patient education that was completed: N/A  Recommendations to the care team: Criteria not met for RT Protocol.     Vital Signs:  Pulse: 92  RR: 18 PER MINUTE  SpO2: 95 %  O2 Device: None (Room air)  Liter Flow:    O2%:      Breath Sounds:   Right Apex Breath Sounds: Clear (Implies normal)  Right Base Breath Sounds: Decreased  Left Apex Breath Sounds: Clear (Implies normal)  Left Base Breath Sounds: Decreased  Respiratory Effort:   Respiratory WDL: Within Defined Limits  Respiratory Effort/Pattern: Unlabored  Comments:

## 2023-01-28 NOTE — Progress Notes
Chaplain Note:    Admit Date: 01/27/2023         Responded to rapid page. Family present. No contact.        Date/Time:                      User:                                      01/27/2023 10:12 PM Jerrye Noble      PCU 2 PCU

## 2023-01-28 NOTE — Care Coordination-Inpatient
RAPID RESPONSE:    Rapid response called for low blood pressure.  - I did call for blood pressure of 79/54 mmHg, and patient complaining of dizziness.  On my arrival, repeating in the room, patient complaining of diffuse body aches but reported dizziness has improved.  Denies SOB, chest pain, fever, chills, abdominal pain, or difficulty urination.  No active bleeding    O/E    Alert, oriented x 4 per  Clear chest auscultation.  Abdomen soft, nontender  Extremities warm.      Orders:    EKG-baseline LBBB, no new dynamic ischemic changes.  Lactic acid 0.8.  Procalcitonin 0.08  TSH 1.22.  Troponin, random cortisol, and CMP pending.  CTA chest negative for PE on 01/26/2023-at OSH    Plan:    -Patient with history of HFrEF, and baseline low blood pressure.  Dropped down further following receiving oxycodone, Flexeril, and fentanyl.  -Given 500 cc NS bolus with NICOM, SVI 7.6%.  Avoid further fluid resuscitation.  - Currently asymptomatic, SBP 101 and with stable mentation, and lactic acid will continue to monitor.  - Hold oxycodone, Flexeril, and metoprolol.  If dropped blood pressure again, MAP less than 65 mmHg, ICU to evaluate bedside/POCUS.  Appreciate Rapid response team assistance.    Domingo Pulse, MD

## 2023-01-29 ENCOUNTER — Observation Stay: Admit: 2023-01-29 | Discharge: 2023-01-29 | Payer: MEDICAID | Primary: Family

## 2023-01-29 ENCOUNTER — Encounter: Admit: 2023-01-29 | Discharge: 2023-01-29 | Payer: MEDICAID | Primary: Family

## 2023-01-29 MED ADMIN — FENTANYL CITRATE (PF) 50 MCG/ML IJ SOLN [3037]: 25 ug | INTRAVENOUS | NDC 00409909412

## 2023-01-29 MED ADMIN — OXYCODONE 10 MG PO TAB [166908]: 10 mg | ORAL | @ 15:00:00 | Stop: 2023-01-29 | NDC 68084096811

## 2023-01-29 MED ADMIN — FENTANYL CITRATE (PF) 50 MCG/ML IJ SOLN [3037]: 25 ug | INTRAVENOUS | @ 19:00:00 | Stop: 2023-01-29 | NDC 00409909412

## 2023-01-29 MED ADMIN — FENTANYL CITRATE (PF) 50 MCG/ML IJ SOLN [3037]: 25 ug | INTRAVENOUS | @ 07:00:00 | Stop: 2023-01-29 | NDC 00409909412

## 2023-01-29 MED ADMIN — FENTANYL CITRATE (PF) 50 MCG/ML IJ SOLN [3037]: 25 ug | INTRAVENOUS | @ 10:00:00 | Stop: 2023-01-29 | NDC 00409909412

## 2023-01-29 MED ADMIN — MORPHINE 15 MG PO TAB [5178]: 15 mg | ORAL | @ 21:00:00 | NDC 60687061711

## 2023-01-29 MED ADMIN — PERFLUTREN LIPID MICROSPHERES 1.1 MG/ML IV SUSP [79178]: 3 mL | INTRAVENOUS | @ 13:00:00 | Stop: 2023-01-29 | NDC 11994001116

## 2023-01-29 MED ADMIN — FENTANYL CITRATE (PF) 50 MCG/ML IJ SOLN [3037]: 25 ug | INTRAVENOUS | @ 04:00:00 | NDC 00409909412

## 2023-01-29 MED ADMIN — FENTANYL CITRATE (PF) 50 MCG/ML IJ SOLN [3037]: 25 ug | INTRAVENOUS | @ 15:00:00 | Stop: 2023-01-29 | NDC 00409909412

## 2023-01-29 MED ADMIN — FENTANYL CITRATE (PF) 50 MCG/ML IJ SOLN [3037]: 25 ug | INTRAVENOUS | @ 17:00:00 | Stop: 2023-01-29 | NDC 00409909412

## 2023-01-29 MED ADMIN — SODIUM CHLORIDE 0.9 % IJ SOLN [7319]: 10 mL | INTRAVENOUS | @ 13:00:00 | Stop: 2023-01-29 | NDC 00409488820

## 2023-01-29 MED ADMIN — OXYCODONE 10 MG PO TAB [166908]: 10 mg | ORAL | @ 09:00:00 | Stop: 2023-01-29 | NDC 68084096811

## 2023-01-29 MED ADMIN — OXYCODONE 10 MG PO TAB [166908]: 10 mg | ORAL | @ 02:00:00 | NDC 68084096811

## 2023-01-29 MED ADMIN — FENTANYL CITRATE (PF) 50 MCG/ML IJ SOLN [3037]: 25 ug | INTRAVENOUS | @ 12:00:00 | Stop: 2023-01-29 | NDC 00409909412

## 2023-01-30 ENCOUNTER — Encounter: Admit: 2023-01-30 | Discharge: 2023-01-30 | Payer: MEDICAID | Primary: Family

## 2023-01-30 ENCOUNTER — Inpatient Hospital Stay: Admit: 2023-01-30 | Discharge: 2023-01-29 | Payer: MEDICAID | Primary: Family

## 2023-01-30 MED ADMIN — SACUBITRIL-VALSARTAN 24-26 MG PO TAB [325930]: 1 | ORAL | @ 03:00:00 | NDC 00078065920

## 2023-01-30 MED ADMIN — TRIMETHOBENZAMIDE 100 MG/ML IM SOLN [8178]: 200 mg | INTRAMUSCULAR | @ 06:00:00 | Stop: 2023-01-30 | NDC 42023011925

## 2023-01-30 MED ADMIN — MORPHINE 15 MG PO TAB [5178]: 15 mg | ORAL | @ 16:00:00 | Stop: 2023-01-30 | NDC 00406511823

## 2023-01-30 MED ADMIN — METOPROLOL SUCCINATE 25 MG PO TB24 [81866]: 12.5 mg | ORAL | @ 03:00:00 | NDC 00904632261

## 2023-01-30 MED ADMIN — SACUBITRIL-VALSARTAN 24-26 MG PO TAB [325930]: 1 | ORAL | @ 13:00:00 | Stop: 2023-01-30 | NDC 00078065920

## 2023-01-30 MED ADMIN — ASPIRIN 81 MG PO TBEC [14113]: 81 mg | ORAL | @ 13:00:00 | Stop: 2023-01-30 | NDC 63739021202

## 2023-01-30 MED ADMIN — DULOXETINE 30 MG PO CPDR [93424]: 30 mg | ORAL | @ 13:00:00 | Stop: 2023-01-30 | NDC 00904704461

## 2023-01-30 MED ADMIN — CELECOXIB 100 MG PO CAP [82263]: 100 mg | ORAL | @ 03:00:00 | NDC 00904650261

## 2023-01-30 MED ADMIN — DULOXETINE 30 MG PO CPDR [93424]: 30 mg | ORAL | NDC 00904704461

## 2023-01-30 MED ADMIN — IRON SUCROSE 100 MG IRON/5 ML IV SOLN GROUP [280019]: 200 mg | INTRAVENOUS | Stop: 2023-01-30 | NDC 00517234001

## 2023-01-30 MED ADMIN — METOPROLOL SUCCINATE 25 MG PO TB24 [81866]: 12.5 mg | ORAL | @ 13:00:00 | Stop: 2023-01-30 | NDC 00904632261

## 2023-01-30 MED ADMIN — ASPIRIN 81 MG PO TBEC [14113]: 81 mg | ORAL | NDC 63739021202

## 2023-01-30 MED ADMIN — MORPHINE 15 MG PO TAB [5178]: 15 mg | ORAL | @ 10:00:00 | Stop: 2023-01-30 | NDC 00406511823

## 2023-01-30 MED ADMIN — MORPHINE 15 MG PO TAB [5178]: 15 mg | ORAL | @ 03:00:00 | NDC 00406511823

## 2023-01-30 MED ADMIN — CELECOXIB 100 MG PO CAP [82263]: 100 mg | ORAL | @ 13:00:00 | Stop: 2023-01-30 | NDC 00904650261

## 2023-01-30 MED FILL — METOPROLOL SUCCINATE 25 MG PO TB24: 25 mg | ORAL | 30 days supply | Qty: 120 | Fill #1 | Status: CP

## 2023-01-30 MED FILL — DULOXETINE 30 MG PO CPDR: 30 mg | ORAL | 30 days supply | Qty: 30 | Fill #1 | Status: CP

## 2023-01-30 MED FILL — ASPIRIN 81 MG PO TBEC: 81 mg | ORAL | 30 days supply | Qty: 30 | Fill #1 | Status: CP

## 2023-02-21 ENCOUNTER — Encounter: Admit: 2023-02-21 | Discharge: 2023-02-21 | Payer: MEDICAID | Primary: Family

## 2023-06-08 ENCOUNTER — Emergency Department: Admit: 2023-06-08 | Discharge: 2023-06-08 | Payer: MEDICAID

## 2023-06-08 ENCOUNTER — Encounter: Admit: 2023-06-08 | Discharge: 2023-06-08 | Payer: MEDICAID | Primary: Family

## 2023-06-08 DIAGNOSIS — I447 Left bundle-branch block, unspecified: Secondary | ICD-10-CM

## 2023-06-08 DIAGNOSIS — M545 Chronic right-sided low back pain without sciatica: Secondary | ICD-10-CM

## 2023-06-08 DIAGNOSIS — R079 Chest pain, unspecified: Secondary | ICD-10-CM

## 2023-06-08 LAB — COMPREHENSIVE METABOLIC PANEL
ALBUMIN: 3.8 g/dL (ref 3.5–5.0)
ALK PHOSPHATASE: 128 U/L — ABNORMAL HIGH (ref 25–110)
ALT: 9 U/L (ref 7–56)
ANION GAP: 13 10*3/uL — ABNORMAL HIGH (ref 3–12)
AST: 17 U/L (ref 7–40)
BLD UREA NITROGEN: 5 mg/dL — ABNORMAL LOW (ref 7–25)
CALCIUM: 8.9 mg/dL — ABNORMAL HIGH (ref 8.5–10.6)
CO2: 23 MMOL/L (ref 21–30)
CREATININE: 0.6 mg/dL (ref 0.4–1.00)
EGFR: >60 mL/min (ref >60)
GLUCOSE,PANEL: 82 mg/dL — ABNORMAL LOW (ref 70–100)
POTASSIUM: 4.4 MMOL/L (ref 3.5–5.1)
SODIUM: 137 MMOL/L — ABNORMAL HIGH (ref 137–147)
TOTAL BILIRUBIN: 0.4 mg/dL (ref 0.2–1.3)
TOTAL PROTEIN: 6.9 g/dL (ref 6.0–8.0)

## 2023-06-08 LAB — HIGH SENSITIVITY TROPONIN I, RANDOM: HIGH SENSITIVITY TROPONIN I: 4 ng/L (ref <12)

## 2023-06-08 LAB — CBC AND DIFF
ABSOLUTE BASO COUNT: 0.1 10*3/uL (ref 0–0.20)
ABSOLUTE EOS COUNT: 0.2 10*3/uL (ref 0–0.45)
ABSOLUTE MONO COUNT: 0.6 10*3/uL (ref 0–0.80)
MDW (MONOCYTE DISTRIBUTION WIDTH): 19 (ref <20.7)
WBC COUNT: 11 10*3/uL — ABNORMAL HIGH (ref 4.5–11.0)

## 2023-06-08 LAB — HIGH SENSITIVITY TROPONIN I 0 HOUR: HIGH SENSITIVITY TROPONIN I 0 HOUR: 3 ng/L (ref <12)

## 2023-06-08 MED ORDER — KETOROLAC 15 MG/ML IJ SOLN
15 mg | Freq: Once | INTRAVENOUS | 0 refills | Status: CP
Start: 2023-06-08 — End: ?
  Administered 2023-06-08: 23:00:00 15 mg via INTRAVENOUS

## 2023-06-08 MED ORDER — ASPIRIN 81 MG PO CHEW
324 mg | Freq: Once | ORAL | 0 refills | Status: CP
Start: 2023-06-08 — End: ?
  Administered 2023-06-08: 21:00:00 324 mg via ORAL

## 2023-06-08 MED ORDER — MORPHINE 4 MG/ML IV SYRG
4 mg | Freq: Once | INTRAVENOUS | 0 refills | Status: CP
Start: 2023-06-08 — End: ?
  Administered 2023-06-08: 21:00:00 4 mg via INTRAVENOUS

## 2023-06-08 MED ORDER — OXYCODONE 10 MG PO TAB
10 mg | Freq: Once | ORAL | 0 refills | Status: CP
Start: 2023-06-08 — End: ?
  Administered 2023-06-08: 22:00:00 10 mg via ORAL

## 2023-06-08 MED ORDER — MORPHINE 4 MG/ML IV SYRG
4-8 mg | INTRAVENOUS | 0 refills | Status: DC | PRN
Start: 2023-06-08 — End: 2023-06-08

## 2023-06-08 NOTE — ED Provider Notes
Rhonda Prince is a 37 y.o. female.    Chief Complaint:  Chief Complaint   Patient presents with    Chest Pain     Associated SOB, radiates to L arm and neck, x 4 days, hx of triple bypass/stents       History of Present Illness:  37 year old female with history of coronary artery disease, CABG, stents with chest pain.  She tells me this chest pain has been present in the left chest for the past 4 days.  It is constant in nature, radiates to the left arm and left back.  Nothing makes it better or worse including exertion.  It is not associated with sweating or shortness of breath at this time.  She sees a cardiologist in [place].  She has tried aspirin, oxycodone, Imdur, Valium and Tylenol without relief.  She tells me that she does not frequently get chest pain but has had this particular episode for the past 4 days constantly.          Review of Systems:  Review of Systems   Constitutional:  Negative for fever.   HENT:  Negative for sore throat.    Eyes:  Negative for visual disturbance.   Respiratory:  Negative for shortness of breath.    Cardiovascular:  Positive for chest pain. Negative for palpitations and leg swelling.   Gastrointestinal:  Negative for abdominal pain.   Genitourinary:  Negative for dysuria.   Musculoskeletal:  Negative for back pain.   Skin:  Negative for rash.   Neurological:  Negative for headaches.       Allergies:  Morphine, Pcn [penicillins], and Jardiance [empagliflozin]    Past Medical History:  Past Medical History:   Diagnosis Date    Chronic back pain     Coronary artery disease     Ovarian cyst        Past Surgical History:  Surgical History:   Procedure Laterality Date    HX HEART CATHETERIZATION  02/11/2022    Left heart catheterization with PCI to LAD    HX APPENDECTOMY      HX HYSTERECTOMY         Pertinent medical/surgical history reviewed  Past Medical History:   Diagnosis Date    Chronic back pain     Coronary artery disease     Ovarian cyst      Surgical History: Procedure Laterality Date    HX HEART CATHETERIZATION  02/11/2022    Left heart catheterization with PCI to LAD    HX APPENDECTOMY      HX HYSTERECTOMY         Social History:  Social History     Tobacco Use    Smoking status: Every Day     Current packs/day: 1.50     Average packs/day: 1.5 packs/day for 11.0 years (16.5 ttl pk-yrs)     Types: Cigarettes    Smokeless tobacco: Never   Vaping Use    Vaping status: Never Used   Substance Use Topics    Alcohol use: Not Currently    Drug use: No     Social History     Substance and Sexual Activity   Drug Use No             Family History:  No family history on file.    Vitals:  ED Vitals      Date and Time T BP P RR SPO2P SPO2 User   06/08/23 1800 -- 119/92 71  12 PER MINUTE -- 95 % LR   06/08/23 1746 -- 128/81 68 14 PER MINUTE -- -- LR   06/08/23 1700 -- 128/91 66 12 PER MINUTE -- 95 % LR   06/08/23 1630 -- 120/86 98 23 PER MINUTE -- 96 % LR   06/08/23 1530 -- 128/99 61 9 PER MINUTE -- 95 % LR   06/08/23 1500 -- 124/76 63 26 PER MINUTE -- 93 % RH   06/08/23 1326 37.1 ?C (98.8 ?F) 106/71 72 18 PER MINUTE -- 99 % JP   06/08/23 1122 36.2 ?C (97.2 ?F) 129/89 82 15 PER MINUTE -- 99 % JF            Physical Exam:  Physical Exam  Vitals and nursing note reviewed.   Constitutional:       Appearance: Normal appearance. She is normal weight.   HENT:      Head: Normocephalic and atraumatic.   Eyes:      Extraocular Movements: Extraocular movements intact.      Conjunctiva/sclera: Conjunctivae normal.   Cardiovascular:      Rate and Rhythm: Normal rate and regular rhythm.   Pulmonary:      Effort: Pulmonary effort is normal. No respiratory distress.      Breath sounds: Normal breath sounds. No wheezing or rales.   Abdominal:      General: Bowel sounds are normal. There is no distension.      Palpations: Abdomen is soft.      Tenderness: There is no abdominal tenderness.   Musculoskeletal:         General: Normal range of motion.      Cervical back: Neck supple.   Neurological: Mental Status: She is alert and oriented to person, place, and time. Mental status is at baseline.   Psychiatric:         Mood and Affect: Mood normal.         Behavior: Behavior normal.         Laboratory Results:  Labs Reviewed   CBC AND DIFF - Abnormal       Result Value Ref Range Status    White Blood Cells 11.2 (*) 4.5 - 11.0 K/UL Final    RBC 5.52 (*) 4.0 - 5.0 M/UL Final    Hemoglobin 14.0  12.0 - 15.0 GM/DL Final    Hematocrit 14.7  36 - 45 % Final    MCV 77.9 (*) 80 - 100 FL Final    MCH 25.4 (*) 26 - 34 PG Final    MCHC 32.6  32.0 - 36.0 G/DL Final    RDW 82.9 (*) 11 - 15 % Final    Platelet Count 329  150 - 400 K/UL Final    MPV 8.0  7 - 11 FL Final    Neutrophils 67  41 - 77 % Final    Lymphocytes 24  24 - 44 % Final    Monocytes 6  4 - 12 % Final    Eosinophils 2  0 - 5 % Final    Basophils 1  0 - 2 % Final    Absolute Neutrophil Count 7.48 (*) 1.8 - 7.0 K/UL Final    Absolute Lymph Count 2.67  1.0 - 4.8 K/UL Final    Absolute Monocyte Count 0.67  0 - 0.80 K/UL Final    Absolute Eosinophil Count 0.22  0 - 0.45 K/UL Final    Absolute Basophil Count 0.13  0 - 0.20 K/UL Final  MDW (Monocyte Distribution Width) 19.3  <20.7 Final   COMPREHENSIVE METABOLIC PANEL - Abnormal    Sodium 137  137 - 147 MMOL/L Final    Potassium 4.4  3.5 - 5.1 MMOL/L Final    Chloride 101  98 - 110 MMOL/L Final    Glucose 82  70 - 100 MG/DL Final    Blood Urea Nitrogen 5 (*) 7 - 25 MG/DL Final    Creatinine 1.61  0.4 - 1.00 MG/DL Final    Calcium 8.9  8.5 - 10.6 MG/DL Final    Total Protein 6.9  6.0 - 8.0 G/DL Final    Total Bilirubin 0.4  0.2 - 1.3 MG/DL Final    Albumin 3.8  3.5 - 5.0 G/DL Final    Alk Phosphatase 128 (*) 25 - 110 U/L Final    AST (SGOT) 17  7 - 40 U/L Final    CO2 23  21 - 30 MMOL/L Final    ALT (SGPT) 9  7 - 56 U/L Final    Anion Gap 13 (*) 3 - 12 Final    eGFR >60  >60 mL/min Final   NT-PRO-BNP - Abnormal    NT-Pro-BNP 596.0 (*) <125 pg/mL Final   HIGH SENSITIVITY TROPONIN I 0 HOUR    hs Troponin I 0 Hour 3 <12 ng/L Final   HIGH SENSITIVITY TROPONIN I, RANDOM    hs Troponin I, Random 4  <12 ng/L Final   HIGH SENSITIVITY TROPONIN I 4 HR          Radiology Interpretation:  CHEST SINGLE VIEW   Final Result         Trace left pleural effusion with associated basilar atelectasis.          Approved by Dalphine Handing, MD on 06/08/2023 3:50 PM      By my electronic signature, I attest that I have personally reviewed the images for this examination and formulated the interpretations and opinions expressed in this report          Finalized by Golda Acre, M.D. on 06/08/2023 3:52 PM. Dictated by Dalphine Handing, MD on 06/08/2023 3:01 PM.             EKG:  ECG Results              [facility] MAIN ECG TRIAGE ONLY (Final result)        Collection Time Result Time VT RATE P-R Interval QRS DURATION Q-T Interval QTC Calc Bazett    06/08/23 11:22:06 06/08/23 14:50:21 75 156 132 388 433             Collection Time Result Time P Axis R Axis T Axis    06/08/23 11:22:06 06/08/23 14:50:21 56 2 127                     Final result                   Impression:    Normal sinus rhythm  Left bundle branch block  No STEMI  When compared with ECG of 28-Jan-2023 12:26,  QT has shortened  Confirmed by Gust Rung ([id number]) on 06/08/2023 2:50:18 PM                                    Medical Decision Making:  Nakaiya Beddow is a 37 y.o. female who presents with chief complaint  as listed above. Based on the history and presentation, the list of differential diagnoses considered included, but was not limited to, ACS, electrolyte abnormality, pleural effusion, CHF. Pt is well's criteria low and PERC negative.      ED Course    ED Course as of 06/08/23 2004   Fri Jun 08, 2023   1655 Reviewed records from [facility].  Patient had a cardiac catheterization back in April with her cardiologist there.  She has a known ejection fraction of 30%.  She has had multiple emergency department visits for chest pain in the past month.  She was initially given morphine for pain and developed a rash on her arm.  She has tolerated morphine many times before.  Oxycodone was ordered following this.  She was also given aspirin.  Her lab work shows slightly elevated white blood cell count but completely normal troponin.  proBNP at 596 slightly increased from previous without significant CHF on x-ray.  She does have significant cardiac history, heart score is 3 however.  Patient and father do tell me that she has been told by her cardiologist and cardiothoracic surgeon that this pain is not cardiac.  However, awaiting second troponin.  If continues to be negative, anticipate discharge with cardiology follow-up [SB]   1755 Repeat troponin is flat at 4.  Patient stable for discharge with outpatient follow-up at this time.  Additional verbal discharge instructions discussed [SB]      ED Course User Index  [SB] Fuller Song L, DO       Complexity of Problems Addressed  Patient's active diagnoses as well as contributing pre-existing medical problems include:  Clinical Impression   Chest pain, unspecified type   Chronic right-sided low back pain without sciatica   Left bundle branch block     Evaluation performed for potential threat to life or bodily function during this visit given the initial differential diagnosis and clinical impression(s) as discussed previously in MDM/ED course.    Additional data reviewed:    History was obtained from an independent historian: Family  Prior non-ED notes reviewed: Recent discharge summary and/or H&P and Clinic note  Independent interpretation of diagnostic tests was performed by me: X-Ray: trace effusion  Patient presentation/management was discussed with the following qualified health care professionals and/or other relevant professionals: Not in addition to what is mentioned above    Risk evaluation:    Diagnosis or treatment of patient condition impacted by social determinant of health: None  Tests Considered but not performed due to clinical scoring (if not mentioned in ED course, aside from what is implied by clinical scores listed): na  Rationale regarding whether admission or escalation of care considered if not performed (if not mentioned in ED course, aside from what is implied by clinical scores listed): na    ED Scoring:                      HEART Total Score: 3  Low (0-3 points), with 0.9-1.7% risk over the next 6 weeks.  Moderate (4-7 points), with 12.0-16.6% risk over the next 6 weeks.  High (8-10 points), with 5--65% risk over the next 6 weeks.         Facility Administered Meds:  Medications   aspirin chewable tablet 324 mg (324 mg Oral Given 06/08/23 1533)   morphine injection 4 mg (4 mg Intravenous Given 06/08/23 1533)   oxyCODONE tablet 10 mg (10 mg Oral Given 06/08/23 1659)   ketorolac (TORADOL)  injection 15 mg (15 mg Intravenous Given 06/08/23 1801)       Clinical Impression:  Clinical Impression   Chest pain, unspecified type   Chronic right-sided low back pain without sciatica   Left bundle branch block       Disposition/Follow up  ED Disposition     ED Disposition   Discharge           Cardiovascular Medicine: [facility]  [address]  [place], [address]  [place] [address]  [phone]  In 1 week        Medications:  Discharge Medication List as of 06/08/2023  6:14 PM          Procedure Notes:  Procedures       Attestation / Supervision:  I personally performed the E/M including history, physical exam, and MDM.      Layne Benton, DO

## 2023-06-08 NOTE — ED Notes
Pt arrived to ED59 with cc of CP. Pt seen at OSH a few days ago for same cc. Pt reports that she has been having L sided CP that radiates down L arm and up to L side of neck. Pt reports that pain started while she is at rest and she endorses intermittent periods of SOB on exertion. Pt is A&Ox4, VSS, skin is w.d.I., breathing is even and non labored while resting in ED cart.

## 2023-06-08 NOTE — ED Notes
Pt notified nursing staff of hives after morphine administration.  Dr. Davene Costain notified.

## 2023-06-15 ENCOUNTER — Encounter: Admit: 2023-06-15 | Discharge: 2023-06-15 | Payer: MEDICAID | Primary: Family

## 2023-06-21 ENCOUNTER — Encounter: Admit: 2023-06-21 | Discharge: 2023-06-21 | Payer: MEDICAID | Primary: Family

## 2023-06-21 NOTE — Progress Notes
Records Request    Jovon Streetman DOB: 09/01/1986    STAT    Medical records request for continuation of care:    Patient has appointment with Dr. Sandria Manly.    Please fax records to Cardiovascular Medicine [facility] [phone]    Request records:    Please Cloud Images:    Cath 01/02/23    CTA Chest 06/06/23    XR Chest 06/06/23    Echo 04/10/23    Thank you,      Cardiovascular Medicine  [facility]  [address]  [place], [place] [address]  Phone:  [phone]  Fax:  [phone]

## 2023-10-18 ENCOUNTER — Encounter: Admit: 2023-10-18 | Discharge: 2023-10-18 | Payer: MEDICAID | Primary: Family

## 2023-10-18 ENCOUNTER — Emergency Department: Admit: 2023-10-18 | Discharge: 2023-10-19 | Payer: MEDICAID

## 2023-10-18 ENCOUNTER — Inpatient Hospital Stay
Admit: 2023-10-18 | Discharge: 2023-10-19 | Discharge status: Against Medical Advice | Payer: MEDICAID | Attending: Student in an Organized Health Care Education/Training Program | Admitting: Student in an Organized Health Care Education/Training Program

## 2023-10-18 DIAGNOSIS — R7989 Other specified abnormal findings of blood chemistry: Secondary | ICD-10-CM

## 2023-10-18 DIAGNOSIS — R079 Chest pain, unspecified: Secondary | ICD-10-CM

## 2023-10-18 DIAGNOSIS — R072 Precordial pain: Secondary | ICD-10-CM

## 2023-10-18 MED ORDER — ACETAMINOPHEN 500 MG PO TAB
1000 mg | Freq: Once | ORAL | 0 refills | Status: CP
Start: 2023-10-18 — End: ?

## 2023-10-18 MED ORDER — IMS MIXTURE TEMPLATE
800 mg | Freq: Once | ORAL | 0 refills | Status: CP
Start: 2023-10-18 — End: ?

## 2023-10-18 MED ORDER — NITROGLYCERIN 0.4 MG SL SUBL
.4 mg | Freq: Once | SUBLINGUAL | 0 refills | Status: CP
Start: 2023-10-18 — End: ?

## 2023-10-18 MED ORDER — OXYCODONE 5 MG PO TAB
5 mg | Freq: Once | ORAL | 0 refills | Status: CP
Start: 2023-10-18 — End: ?

## 2023-10-18 NOTE — ED Notes
This RN to bedside for shift assessment. Patient's father at bedside. Call light in reach. Patient c/o 10/10 pain. Provider notified. See new orders.

## 2023-10-18 NOTE — ED Notes
ED Initial Provider Note:    This patient was seen in the ED triage area to initiate and expedite the patients ED care when possible.    ED Chief Complaint:   Chief Complaint   Patient presents with    Chest Pain     Mid CP radiating to R arm X several days, worse today, took 3 nitros and 324 ASA with some relief, SOA, lightheaded, - N/V       S: Rhonda Prince is a 38 y.o. female who presents to the Emergency Department for chest pain for the past few days, worse today. Also endorses lightheadedness, SOA, and n/v. Pt took 3 nitro and 324 mg ASA.     PMHx:  Past Medical History:    Chronic back pain    Coronary artery disease    Ovarian cyst       BP 100/67 (BP Source: Arm, Left Upper)  - Pulse 112  - Temp 37.8 ?C (100.1 ?F)  - Wt 84.8 kg (187 lb)  - LMP 03/05/2013  - SpO2 97%  - BMI 30.18 kg/m?    O: Brief Physical: Pt is a&ox3 and ambulatory. No respiratory distress.    A/P: The patient was seen by me as an initial provider in triage. A brief history and physical was obtained. My exam is intended to be an initial medial screening exam. Initial orders have been placed by me. My working diagnosis is ACS.    The patient is deemed appropriate for the main ED. The patient's care will be resumed by the ED provider care team once the patient is roomed in the ED. A more detailed / complete H&P will be documented by those providers.

## 2023-10-18 NOTE — ED Notes
IV attempt unsuccessful. Patient states  I normally use ultrasound. Patient would like to wait for an ultrasound

## 2023-10-18 NOTE — ED Provider Notes
Summer Mccolgan is a 38 y.o. female.    Chief Complaint:  Chief Complaint   Patient presents with    Chest Pain     Mid CP radiating to R arm X several days, worse today, took 3 nitros and 324 ASA with some relief, SOA, lightheaded, - N/V       History of Present Illness:  Jermya Dowding is a 38 y.o. female, with a history of chronic back pain, CAD, and ovarian cyst, who presents to the emergency department for chest pain. Patient reports having cardiac surgery 1.5 years ago with relief. Patient states starting 4-6 months ago, patient having constant chest pain, worsening today, rating pain level at a 10/10. Patient explains chest pain located mid-sternally that radiates to her right shoulder, right arm, and neck. Patient took Tylenol, with no relief, and nitroglycerin, with slight relief lasting 10 minutes. Patient also took oxycodone for back pain this morning. Patient denies having any aggravating or relieving factors for pain. Patient endorses going to follow-up appointment with cardiologist however they are unclear what is causing her persistent chest pain.  She also states that she has chronic elevated white count again of unclear etiology.  Patient denies tobacco,  ETOH, or drug use.       History provided by:  Patient, medical records and relative  Language interpreter used: No        Review of Systems:  Review of Systems   Constitutional:  Negative for fever.   Respiratory:  Negative for cough and shortness of breath.    Cardiovascular:  Positive for chest pain.       Allergies:  Morphine, Pcn [penicillins], and Jardiance [empagliflozin]    Past Medical History:  Past Medical History:    Chronic back pain    Coronary artery disease    Ovarian cyst       Past Surgical History:  Surgical History:   Procedure Laterality Date    HX HEART CATHETERIZATION  02/11/2022    Left heart catheterization with PCI to LAD    HX APPENDECTOMY      HX HYSTERECTOMY         Pertinent medical/surgical history reviewed    Social History:  Social History     Tobacco Use    Smoking status: Every Day     Current packs/day: 1.50     Average packs/day: 1.5 packs/day for 11.0 years (16.5 ttl pk-yrs)     Types: Cigarettes    Smokeless tobacco: Never   Vaping Use    Vaping status: Never Used   Substance Use Topics    Alcohol use: Not Currently    Drug use: No     Social History     Substance and Sexual Activity   Drug Use No             Family History:  No family history on file.    Vitals:  ED Vitals      Date and Time T BP P RR SPO2P SPO2 User   10/18/23 2211 -- 121/87 -- -- -- -- LW   10/18/23 2138 -- 117/80 113 25 PER MINUTE -- 96 % CA   10/18/23 2044 36.8 ?C (98.2 ?F) 99/75 91 20 PER MINUTE -- 99 % IR   10/18/23 1949 36.9 ?C (98.4 ?F) 112/68 93 20 PER MINUTE -- 99 % IR   10/18/23 1836 -- 100/67 112 16 PER MINUTE -- 97 % ST   10/18/23 1627 37.8 ?C (100.1 ?F)  119/103 109 20 PER MINUTE -- 99 % HH            Physical Exam:  Physical Exam  Vitals and nursing note reviewed.   Constitutional:       General: She is awake. She is not in acute distress.     Appearance: Normal appearance. She is well-developed. She is obese. She is not ill-appearing.   HENT:      Head: Normocephalic and atraumatic.      Right Ear: External ear normal.      Left Ear: External ear normal.      Nose: Nose normal.      Mouth/Throat:      Pharynx: Oropharynx is clear.   Eyes:      General: Lids are normal.      Extraocular Movements: Extraocular movements intact.      Conjunctiva/sclera: Conjunctivae normal.      Pupils: Pupils are equal, round, and reactive to light.   Cardiovascular:      Rate and Rhythm: Normal rate and regular rhythm.      Pulses: Normal pulses.   Pulmonary:      Effort: Pulmonary effort is normal. No respiratory distress.      Breath sounds: Normal breath sounds and air entry. No wheezing, rhonchi or rales.   Chest:      Comments: Chest pain is non reproducible   Abdominal:      General: There is no distension.      Palpations: Abdomen is soft.      Tenderness: There is no abdominal tenderness. There is no guarding or rebound.   Musculoskeletal:         General: No deformity or signs of injury. Normal range of motion.      Cervical back: Normal range of motion and neck supple.      Right lower leg: No edema.      Left lower leg: No edema.   Skin:     General: Skin is warm and dry.   Neurological:      General: No focal deficit present.      Mental Status: She is alert and oriented to person, place, and time. Mental status is at baseline.   Psychiatric:         Mood and Affect: Mood normal.         Behavior: Behavior is cooperative.         Laboratory Results:  Labs Reviewed   CBC AND DIFF - Abnormal       Result Value Ref Range Status    White Blood Cells 16.80 (*) 4.50 - 11.00 10*3/uL Final    Red Blood Cells 4.93  4.00 - 5.00 10*6/uL Final    Hemoglobin 13.5  12.0 - 15.0 g/dL Final    Hematocrit 09.8  36.0 - 45.0 % Final    MCV 83.9  80.0 - 100.0 fL Final    MCH 27.5  26.0 - 34.0 pg Final    MCHC 32.7  32.0 - 36.0 g/dL Final    RDW 11.9  14.7 - 15.0 % Final    Platelet Count 356  150 - 400 10*3/uL Final    MPV 8.2  7.0 - 11.0 fL Final    Neutrophils 65  41 - 77 % Final    Lymphocytes 26  24 - 44 % Final    Monocytes 5  4 - 12 % Final    Eosinophils 3  0 - 5 % Final  Basophils 1  0 - 2 % Final    Absolute Neutrophil Count 10.90 (*) 1.80 - 7.00 10*3/uL Final    Absolute Lymph Count 4.40  1.00 - 4.80 10*3/uL Final    Absolute Monocyte Count 0.90 (*) 0.00 - 0.80 10*3/uL Final    Absolute Eosinophil Count 0.40  0.00 - 0.45 10*3/uL Final    Absolute Basophil Count 0.20  0.00 - 0.20 10*3/uL Final    MDW (Monocyte Distribution Width) 18.6  <=20.6 Final   COMPREHENSIVE METABOLIC PANEL - Abnormal    Sodium 138  137 - 147 mmol/L Final    Potassium 3.6  3.5 - 5.1 mmol/L Final    Chloride 101  98 - 110 mmol/L Final    Glucose 84  70 - 100 mg/dL Final    Blood Urea Nitrogen 7  7 - 25 mg/dL Final    Creatinine 1.61  0.40 - 1.00 mg/dL Final    Calcium 9.1 8.5 - 10.6 mg/dL Final    Total Protein 7.5  6.0 - 8.0 g/dL Final    Total Bilirubin 0.5  0.2 - 1.3 mg/dL Final    Albumin 4.5  3.5 - 5.0 g/dL Final    Alk Phosphatase 115 (*) 25 - 110 U/L Final    AST 13  7 - 40 U/L Final    ALT 14  7 - 56 U/L Final    CO2 23  21 - 30 mmol/L Final    Anion Gap 14 (*) 3 - 12 Final    Glomerular Filtration Rate (GFR) >60  >60 mL/min Final   NT-PRO-BNP - Abnormal    NT-Pro-BNP 131 (*) <125 pg/mL Final   HIGH SENSITIVITY TROPONIN I 0 HOUR - Normal    hs Troponin I 0 Hour <2  <15 ng/L Final   MAGNESIUM - Normal    Magnesium 2.1  1.6 - 2.6 mg/dL Final   HIGH SENSITIVITY TROPONIN PANEL    Narrative:     The following orders were created for panel order HIGH SENSITIVITY TROPONIN PANEL.  Procedure                               Abnormality         Status                     ---------                               -----------         ------                     HIGH SENSITIVITY TROPON.Marland KitchenMarland Kitchen[[id number]]  Normal              Final result               HIGH SENSITIVITY TROPON.Marland KitchenMarland Kitchen[[id number]]                                                   Please view results for these tests on the individual orders.   HIGH SENSITIVITY TROPONIN I 2 HOUR          Radiology Interpretation:  CHEST SINGLE VIEW   Final Result  Mild left basilar linear opacities, likely atelectasis or scarring. No other acute finding.          Finalized by Sherolyn Buba, M.D. on 10/18/2023 10:27 PM. Dictated by Sherolyn Buba, M.D. on 10/18/2023 10:26 PM.          EKG:  ECG Results              [facility] MAIN ECG TRIAGE ONLY (Final result)        Collection Time Result Time VT RATE P-R Interval QRS DURATION Q-T Interval QTC Calc Bazett    10/18/23 16:01:23 10/18/23 21:06:37 98 160 132 366 467             Collection Time Result Time P Axis R Axis T Axis    10/18/23 16:01:23 10/18/23 21:06:37 59 -41 105                     Final result                   Impression:    Normal sinus rhythm with sinus arrhythmia  Left bundle branch block  T wave abnormality, consider lateral ischemia  Abnormal ECG  When compared with ECG of 08-Jun-2023 11:22,  QRS axis shifted left  T wave inversion less evident in Lateral leads  Confirmed by Sinclair Grooms, Krystyl Cannell ([id number]) on 10/18/2023 9:06:33 PM                                    Medical Decision Making:  Rhealynn Myhre is a 38 y.o. female who presents with chief complaint as listed above. Based on the history and presentation, the list of differential diagnoses considered included, but was not limited to, ACS, CHF, MSK    ED Course  ED Course as of 10/18/23 2320   Thu Oct 18, 2023   6119 38 year old female with history of LBBB, CAD s/p stents, who presents to the ED for chest pain.    Previous records were reviewed and patient has been seen regularly at [facility] ED for similar chest pain.  An extensive chart review reveals multiple ED chest pain workups with most recent evaluation earlier today and prior to that on 10/13/2022.  She has had CABG x 3 in 04/2022 and since then has additionally had an LHC in 12/2022 due to similar chest pain that was clear.    Today, she reports persistent chest pain that is similar but feels that today is especially worse.    Will administer nitroglycerin.   [DR]   2258 Labs notable for slightly elevated BNP.  No significant evidence of volume overload on imaging or clinical exam.  Negative troponin.  Additionally noted nonspecific leukocytosis which patient has had prior history of of unclear etiology.  Low suspicion for infectious process at this time.  Other labs are largely stable.    On reevaluation, patient reported minimal to no relief of her symptoms and continues to endorse 10/10 pain.  Will administer p.o. oxycodone, Tylenol, ibuprofen.    Findings were discussed with patient admission was offered in the setting of intractable pain of unclear etiology and in the setting of significant cardiac history and moderate risk heart score.  They are agreeable.   [DR]      ED Course User Index  [DR] Dola Factor, MD       Complexity of Problems Addressed  Patient's active diagnoses  as well as contributing pre-existing medical problems include:  Clinical Impression   Chest pain, unspecified type   History of coronary artery bypass graft x 3   Elevated brain natriuretic peptide (BNP) level          Additional data reviewed:    History was obtained from an independent historian: Not in addition to what is mentioned above  Prior non-ED notes reviewed: Not in addition to what is mentioned above  Independent interpretation of diagnostic tests was performed by me: Not in addition to what is mentioned above  Patient presentation/management was discussed with the following qualified health care professionals and/or other relevant professionals: Not in addition to what is mentioned above    Risk evaluation:    Diagnosis or treatment of patient condition impacted by social determinant of health: None  Tests Considered but not performed due to clinical scoring (if not mentioned in ED course, aside from what is implied by clinical scores listed): n/a  Rationale regarding whether admission or escalation of care considered if not performed (if not mentioned in ED course, aside from what is implied by clinical scores listed): n/a    ED Scoring:                      HEART Total Score: 4  Low (0-3 points), with 0.9-1.7% risk over the next 6 weeks.  Moderate (4-7 points), with 12.0-16.6% risk over the next 6 weeks.  High (8-10 points), with 5--65% risk over the next 6 weeks.         Facility Administered Meds:  Medications   nitroglycerin (NITROSTAT) tablet 0.4 mg (0.4 mg Sublingual Given 10/18/23 2211)   acetaminophen (TYLENOL EXTRA STRENGTH) tablet 1,000 mg (1,000 mg Oral Given 10/18/23 2253)   ibuprofen (ADVIL) tablet 800 mg (800 mg Oral Given 10/18/23 2253)   oxyCODONE (ROXICODONE) tablet 5 mg (5 mg Oral Given 10/18/23 2253)       Clinical Impression:  Clinical Impression   Chest pain, unspecified type History of coronary artery bypass graft x 3   Elevated brain natriuretic peptide (BNP) level       Disposition/Follow up  ED Disposition     ED Disposition   Admit           No follow-up provider specified.    Medications:  New Prescriptions    No medications on file       Procedure Notes:  Procedures       Attestation / Supervision:  I, Madison Em, am scribing for and in the presence of Geraldina Parrott, MD.  Wyn Forster Em    Note has been documented by [facility] Em on 10/18/2023    I, Jadine Brumley Sinclair Grooms, MD, personally performed the services described in this documentation as scribed and it is both accurate and complete.  Dola Factor, MD

## 2023-10-18 NOTE — H&P (View-Only)
Admission History and Physical Note      Name:  Rhonda Prince                       MRN:  [id number]                Admission Date: 10/18/2023                     Primary Care Physician: Sharene Butters      Chief Complaint: Chest pain        Assessment/Plan:      This is a 38 y.o. female with significant history of CAD s/p PCI and CABG x3, HFrEF, ischemic cardiomyopathy, hyperlipidemia, chronic pain, chronic leukocytosis, GERD, anxiety, who presents to the ER with complaints of chest pain.    Atypical chest pain  History of CAD s/p PCI and CABG x3  Ischemic cardiomyopathy  HFrEF, not in acute exacerbation  -Presents with substernal chest pain, radiates to the right neck and right shoulder, constant, not increased with exertion, not relieved with rest  - Last echo 07/24/2023: Left Ventricle: Left ventricle size is normal. Normal wall thickness. Septal motion is consistent with interventricular conduction delay or bundle branch block. Global hypokinesis present. Mildly reduced left ventricular systolic function. EF by 2D Simpsons Biplane is 53%. Grade I diastolic dysfunction (normal LAP).  Hemodynamically significant valvular disease. Visually, RV systolic function is normal.   -Last LHC 12/2022: Prox RCA lesion is 25% stenosed. Ost LAD lesion is 80% stenosed. Ost Cx to Prox Cx lesion is 90% stenosed.   -EKG seems similar to prior EKG  -HS troponin x1 negative  Plan:  -Given significant cardiac history, will consult cardiology to evaluate for further ischemic workup  -Trend troponin x 3  -Monitor on telemetry  -Continue PTA Aspirin 81 mg, Imdur 30 mg twice daily, Entresto 1 tablet in a.m., 2 tablets in p.m., metoprolol succinate 100 mg in a.m. and 50 mg in p.m.  -Keep NPO at midnight    GERD  -Continue PTA omeprazole 40 mg (formulary substitution with pantoprazole 40 mg)    Anxiety  -PTA Xanax 0.5 mg twice daily as needed    Chronic pain  -Oxycodone 5 mg q6h prn    Chronic leukocytosis  -Significant workup including bone marrow biopsy done in the past negative      Diet: NPO at midnight  DVT PPx: Lovenox Prophylaxis  Code Status: Full code      Katherine Roan MD  Internal Medicine  [facility] 5    If any questions or concerns, Please contact Med Private Night    Note: For any questions/concerns; please page the Team Pager.      I spent a total of 77 minutes of direct patient care in addition to overall management include ordering/reviewing labs, radiology, serial assessment and monitoring regarding patient's condition, discussing with consulting services, disposition plan. Updating the patient and family with the plan of care.        History of Present Illness:     Rhonda Prince is a 38 y.o. female with significant history of CAD s/p PCI and CABG x3, HFrEF, ischemic cardiomyopathy, hyperlipidemia, chronic pain, chronic leukocytosis, GERD, anxiety, who presents to the ER with complaints of chest pain.    Patient reports that her chest pain has been going on for a while but this episode started last Sunday and got significantly worse today.  Pain is substernal and radiates to  the right neck and right shoulder.  Pain is constant, not increased with exertion, not relieved with rest.  She took 3 pills of nitro before coming to the ER which decreased her pain slightly for 20 to 25 minutes.  She had very few episodes of shortness of breath today but she says it is not too bad.  She denies any cough, fevers, chills.  She reports compliance to medications.       Review of Systems:    Review of Systems   Constitutional:  Negative for chills and fever.   HENT:  Negative for sore throat.    Respiratory:  Positive for shortness of breath. Negative for cough.    Cardiovascular:  Positive for chest pain. Negative for leg swelling.   Gastrointestinal:  Negative for abdominal pain, constipation, diarrhea, nausea and vomiting.   Genitourinary:  Negative for dysuria.   Neurological:  Negative for headaches.       Past Medical History:    Chronic back pain    Coronary artery disease    Ovarian cyst       Surgical History:   Procedure Laterality Date    HX HEART CATHETERIZATION  02/11/2022    Left heart catheterization with PCI to LAD    HX APPENDECTOMY      HX HYSTERECTOMY         No family history on file.    Social History     Tobacco Use    Smoking status: Every Day     Current packs/day: 1.50     Average packs/day: 1.5 packs/day for 11.0 years (16.5 ttl pk-yrs)     Types: Cigarettes    Smokeless tobacco: Never   Vaping Use    Vaping status: Never Used   Substance and Sexual Activity    Alcohol use: Not Currently    Drug use: No        All the histories listed above; including Past Medical History, Past Surgical History, Family History and Social History have been reviewed.    Immunizations (includes history and patient reported):   Immunization History   Administered Date(s) Administered    Flu Vaccine =>6 Months Quadrivalent PF 07/12/2020    Pneumococcal Vaccine (23-Val Adult) 01/30/2012           Allergies:  Morphine, Pcn [penicillins], and Jardiance [empagliflozin]    Medications:  No current facility-administered medications for this encounter.     Current Outpatient Medications   Medication Sig    acetaminophen (TYLENOL EXTRA STRENGTH) 500 mg tablet Take one tablet by mouth every 6 hours as needed for Pain. Max of 4,000 mg of acetaminophen in 24 hours.    ALPRAZolam (XANAX) 0.5 mg tablet Take one tablet by mouth twice daily as needed for Anxiety.    aspirin EC (ASPIR-LOW) 81 mg tablet Take one tablet by mouth daily. Indications: blood clot prevention following percutaneous coronary intervention, prevention for a blood clot going to the brain    cyclobenzaprine (FLEXERIL) 10 mg tablet Take one tablet by mouth three times daily as needed.    duloxetine DR (CYMBALTA) 30 mg capsule Take one capsule by mouth daily. Indications: anxiousness associated with depression, neuropathic pain    folic acid (FOLVITE) 1 mg tablet Take one tablet by mouth daily.    metoprolol succinate XL (TOPROL XL) 25 mg extended release tablet Take two tablets by mouth twice daily. Indications: myocardial reinfarction prevention    naloxone (NARCAN) 4 mg/actuation nasal spray Insert one spray into nose as  directed as Needed.    omeprazole DR (PRILOSEC) 40 mg capsule Take one capsule by mouth daily.    oxyCODONE 10 mg tablet Take one tablet by mouth three times daily.    polyethylene glycol 3350 (MIRALAX) 17 g packet Take one packet by mouth daily as needed.    sacubitriL-valsartan (ENTRESTO) 24-26 mg tablet Take one tablet by mouth twice daily.    torsemide (DEMADEX) 20 mg tablet Take one tablet by mouth as Needed (weight gain).       Physical Exam:  Vital Signs: Last Filed In 24 Hours Vital Signs: 24 Hour Range   BP: 121/87 (01/30 2211)  Temp: 36.8 ?C (98.2 ?F) (01/30 2044)  Pulse: 113 (01/30 2138)  Respirations: 25 PER MINUTE (01/30 2138)  SpO2: 96 % (01/30 2138)  O2 Device: None (Room air) (01/30 2044) BP: (99-121)/(67-103)   Temp:  [36.8 ?C (98.2 ?F)-37.8 ?C (100.1 ?F)]   Pulse:  [91-113]   Respirations:  [16 PER MINUTE-25 PER MINUTE]   SpO2:  [96 %-99 %]   O2 Device: None (Room air)   Intensity Pain Scale (Self Report): 10 (10/18/23 2222)        Physical Exam  Vitals reviewed.   HENT:      Head: Normocephalic and atraumatic.   Eyes:      Extraocular Movements: Extraocular movements intact.   Cardiovascular:      Rate and Rhythm: Normal rate and regular rhythm.      Heart sounds: Normal heart sounds.   Pulmonary:      Effort: Pulmonary effort is normal. No respiratory distress.      Breath sounds: No wheezing or rales.   Abdominal:      General: There is no distension.      Palpations: Abdomen is soft.      Tenderness: There is no abdominal tenderness.   Musculoskeletal:      Right lower leg: No edema.      Left lower leg: No edema.   Neurological:      Mental Status: She is alert. Mental status is at baseline.           Lab/Radiology/Other Diagnostic Tests:  24-hour labs:    Results for orders placed or performed during the hospital encounter of 10/18/23 (from the past 24 hours)   CBC AND DIFF    Collection Time: 10/18/23  9:39 PM   Result Value Ref Range    White Blood Cells 16.80 (H) 4.50 - 11.00 10*3/uL    Red Blood Cells 4.93 4.00 - 5.00 10*6/uL    Hemoglobin 13.5 12.0 - 15.0 g/dL    Hematocrit 69.6 29.5 - 45.0 %    MCV 83.9 80.0 - 100.0 fL    MCH 27.5 26.0 - 34.0 pg    MCHC 32.7 32.0 - 36.0 g/dL    RDW 28.4 13.2 - 44.0 %    Platelet Count 356 150 - 400 10*3/uL    MPV 8.2 7.0 - 11.0 fL    Neutrophils 65 41 - 77 %    Lymphocytes 26 24 - 44 %    Monocytes 5 4 - 12 %    Eosinophils 3 0 - 5 %    Basophils 1 0 - 2 %    Absolute Neutrophil Count 10.90 (H) 1.80 - 7.00 10*3/uL    Absolute Lymph Count 4.40 1.00 - 4.80 10*3/uL    Absolute Monocyte Count 0.90 (H) 0.00 - 0.80 10*3/uL    Absolute Eosinophil Count 0.40 0.00 -  0.45 10*3/uL    Absolute Basophil Count 0.20 0.00 - 0.20 10*3/uL    MDW (Monocyte Distribution Width) 18.6 <=20.6   COMPREHENSIVE METABOLIC PANEL    Collection Time: 10/18/23  9:39 PM   Result Value Ref Range    Sodium 138 137 - 147 mmol/L    Potassium 3.6 3.5 - 5.1 mmol/L    Chloride 101 98 - 110 mmol/L    Glucose 84 70 - 100 mg/dL    Blood Urea Nitrogen 7 7 - 25 mg/dL    Creatinine 1.61 0.96 - 1.00 mg/dL    Calcium 9.1 8.5 - 04.5 mg/dL    Total Protein 7.5 6.0 - 8.0 g/dL    Total Bilirubin 0.5 0.2 - 1.3 mg/dL    Albumin 4.5 3.5 - 5.0 g/dL    Alk Phosphatase 409 (H) 25 - 110 U/L    AST 13 7 - 40 U/L    ALT 14 7 - 56 U/L    CO2 23 21 - 30 mmol/L    Anion Gap 14 (H) 3 - 12    Glomerular Filtration Rate (GFR) >60 >60 mL/min   HIGH SENSITIVITY TROPONIN I 0 HOUR    Collection Time: 10/18/23  9:39 PM   Result Value Ref Range    hs Troponin I 0 Hour <2 <15 ng/L   MAGNESIUM    Collection Time: 10/18/23  9:39 PM   Result Value Ref Range    Magnesium 2.1 1.6 - 2.6 mg/dL   NT-PRO-BNP    Collection Time: 10/18/23  9:39 PM   Result Value Ref Range    NT-Pro-BNP 131 (H) <125 pg/mL       Pertinent radiology reviewed.

## 2023-10-19 ENCOUNTER — Encounter: Admit: 2023-10-19 | Discharge: 2023-10-19 | Payer: MEDICAID | Primary: Family

## 2023-10-19 DIAGNOSIS — Z5329 Procedure and treatment not carried out because of patient's decision for other reasons: Secondary | ICD-10-CM

## 2023-10-19 DIAGNOSIS — Z79899 Other long term (current) drug therapy: Secondary | ICD-10-CM

## 2023-10-19 DIAGNOSIS — I5022 Chronic systolic (congestive) heart failure: Secondary | ICD-10-CM

## 2023-10-19 DIAGNOSIS — I454 Nonspecific intraventricular block: Secondary | ICD-10-CM

## 2023-10-19 DIAGNOSIS — K219 Gastro-esophageal reflux disease without esophagitis: Secondary | ICD-10-CM

## 2023-10-19 DIAGNOSIS — Z90710 Acquired absence of both cervix and uterus: Secondary | ICD-10-CM

## 2023-10-19 DIAGNOSIS — E785 Hyperlipidemia, unspecified: Secondary | ICD-10-CM

## 2023-10-19 DIAGNOSIS — I2510 Atherosclerotic heart disease of native coronary artery without angina pectoris: Secondary | ICD-10-CM

## 2023-10-19 DIAGNOSIS — Z951 Presence of aortocoronary bypass graft: Secondary | ICD-10-CM

## 2023-10-19 DIAGNOSIS — Z955 Presence of coronary angioplasty implant and graft: Secondary | ICD-10-CM

## 2023-10-19 DIAGNOSIS — I255 Ischemic cardiomyopathy: Secondary | ICD-10-CM

## 2023-10-19 DIAGNOSIS — Z9049 Acquired absence of other specified parts of digestive tract: Secondary | ICD-10-CM

## 2023-10-19 DIAGNOSIS — G8929 Other chronic pain: Secondary | ICD-10-CM

## 2023-10-19 DIAGNOSIS — F419 Anxiety disorder, unspecified: Secondary | ICD-10-CM

## 2023-10-19 DIAGNOSIS — F17210 Nicotine dependence, cigarettes, uncomplicated: Secondary | ICD-10-CM

## 2023-10-19 DIAGNOSIS — Z7982 Long term (current) use of aspirin: Secondary | ICD-10-CM

## 2023-10-19 LAB — CBC AND DIFF
~~LOC~~ BKR ABSOLUTE BASO COUNT: 0.2 10*3/uL (ref 0.00–0.20)
~~LOC~~ BKR ABSOLUTE BASO COUNT: 0.1 10*3/uL (ref 0.00–0.20)
~~LOC~~ BKR ABSOLUTE EOS COUNT: 0.4 10*3/uL (ref 0.00–0.45)
~~LOC~~ BKR ABSOLUTE EOS COUNT: 0.4 10*3/uL (ref 0.00–0.45)
~~LOC~~ BKR ABSOLUTE LYMPH COUNT: 4.4 10*3/uL (ref 1.00–4.80)
~~LOC~~ BKR ABSOLUTE LYMPH COUNT: 3.8 10*3/uL (ref 1.00–4.80)
~~LOC~~ BKR ABSOLUTE MONO COUNT: 0.9 10*3/uL — ABNORMAL HIGH (ref 0.00–0.80)
~~LOC~~ BKR ABSOLUTE MONO COUNT: 0.7 10*3/uL (ref 0.00–0.80)
~~LOC~~ BKR ABSOLUTE NEUTROPHIL: 10 10*3/uL — ABNORMAL HIGH (ref 1.80–7.00)
~~LOC~~ BKR ABSOLUTE NEUTROPHIL: 6.3 10*3/uL (ref 1.80–7.00)
~~LOC~~ BKR BASOPHILS %: 1 % (ref 0–2)
~~LOC~~ BKR BASOPHILS %: 1 % (ref 0–2)
~~LOC~~ BKR EOSINOPHILS %: 3 % (ref 0–5)
~~LOC~~ BKR EOSINOPHILS %: 3 % (ref 0–5)
~~LOC~~ BKR HEMATOCRIT: 41 % (ref 36.0–45.0)
~~LOC~~ BKR LYMPHOCYTES %: 26 % — ABNORMAL HIGH (ref 24–44)
~~LOC~~ BKR LYMPHOCYTES %: 34 % (ref 24–44)
~~LOC~~ BKR MCH: 27 pg (ref 26.0–34.0)
~~LOC~~ BKR MCHC: 32 g/dL (ref 32.0–36.0)
~~LOC~~ BKR MCV: 83 fL (ref 80.0–100.0)
~~LOC~~ BKR MCV: 84 fL (ref 80.0–100.0)
~~LOC~~ BKR MDW (MONOCYTE DISTRIBUTION WIDTH): 18 (ref <=20.6)
~~LOC~~ BKR MONOCYTES %: 5 % (ref 4–12)
~~LOC~~ BKR MONOCYTES %: 6 % (ref 4–12)
~~LOC~~ BKR MPV: 8.2 fL (ref 7.0–11.0)
~~LOC~~ BKR PLATELET COUNT: 356 10*3/uL (ref 150–400)
~~LOC~~ BKR RBC COUNT: 4.9 10*6/uL (ref 4.00–5.00)
~~LOC~~ BKR RBC COUNT: 4.5 10*6/uL (ref 4.00–5.00)
~~LOC~~ BKR RDW: 15 % (ref 11.0–15.0)
~~LOC~~ BKR WBC COUNT: 16 10*3/uL — ABNORMAL HIGH (ref 4.50–11.00)
~~LOC~~ BKR WBC COUNT: 11 10*3/uL — ABNORMAL HIGH (ref 4.50–11.00)

## 2023-10-19 LAB — BASIC METABOLIC PANEL
~~LOC~~ BKR ANION GAP: 11 fL (ref 3–12)
~~LOC~~ BKR BLD UREA NITROGEN: 7 mg/dL (ref 7–25)
~~LOC~~ BKR CALCIUM: 8.6 mg/dL (ref 8.5–10.6)
~~LOC~~ BKR CHLORIDE: 102 mmol/L (ref 98–110)
~~LOC~~ BKR CO2: 25 mmol/L (ref 21–30)
~~LOC~~ BKR CREATININE: 0.6 mg/dL (ref 0.40–1.00)
~~LOC~~ BKR GLOMERULAR FILTRATION RATE (GFR): >60 mL/min (ref >60–77)

## 2023-10-19 LAB — MAGNESIUM
~~LOC~~ BKR MAGNESIUM: 2.1 mg/dL (ref 1.6–2.6)
~~LOC~~ BKR MAGNESIUM: 2.1 mg/dL (ref 1.6–2.6)

## 2023-10-19 LAB — LIPID PROFILE
~~LOC~~ BKR CHOLESTEROL: 229 mg/dL — ABNORMAL HIGH (ref <200)
~~LOC~~ BKR LDL: 137 mg/dL — ABNORMAL HIGH (ref <100)
~~LOC~~ BKR NON HDL CHOLESTEROL: 198 mg/dL
~~LOC~~ BKR TRIGLYCERIDES: 364 mg/dL — ABNORMAL HIGH (ref <150)
~~LOC~~ BKR VLDL: 72 mg/dL

## 2023-10-19 LAB — COMPREHENSIVE METABOLIC PANEL
~~LOC~~ BKR ALBUMIN: 4.5 g/dL (ref 3.5–5.0)
~~LOC~~ BKR POTASSIUM: 3.6 mmol/L (ref 3.5–5.1)

## 2023-10-19 LAB — KC ED MAIN ECG TRIAGE ONLY
P AXIS: 59 degrees
P-R INTERVAL: 160 ms
Q-T INTERVAL: 366 ms
QRS DURATION: 132 ms
QTC CALCULATION (BAZETT): 467 ms
R AXIS: -41 degrees
T AXIS: 105 degrees
VENTRICULAR RATE: 98 {beats}/min

## 2023-10-19 LAB — HIGH SENSITIVITY TROPONIN I 4 HR: ~~LOC~~ BKR HIGH SENSITIVITY TROPONIN I 4 HOUR: <2 ng/L (ref <15)

## 2023-10-19 LAB — NT-PRO-BNP: ~~LOC~~ BKR NT-PRO-BNP: 131 pg/mL — ABNORMAL HIGH (ref <125)

## 2023-10-19 MED ORDER — ENOXAPARIN 40 MG/0.4 ML SC SYRG
40 mg | Freq: Every day | SUBCUTANEOUS | 0 refills | Status: DC
Start: 2023-10-19 — End: 2023-10-19

## 2023-10-19 MED ORDER — ASPIRIN 81 MG PO TBEC
81 mg | Freq: Every day | ORAL | 0 refills | Status: DC
Start: 2023-10-19 — End: 2023-10-19

## 2023-10-19 MED ORDER — PANTOPRAZOLE 40 MG PO TBEC
40 mg | Freq: Every day | ORAL | 0 refills | Status: DC
Start: 2023-10-19 — End: 2023-10-19
  Administered 2023-10-19: 07:00:00 40 mg via ORAL

## 2023-10-19 MED ORDER — SACUBITRIL-VALSARTAN 24-26 MG PO TAB
1 | Freq: Every day | ORAL | 0 refills | Status: DC
Start: 2023-10-19 — End: 2023-10-19

## 2023-10-19 MED ORDER — SACUBITRIL-VALSARTAN 24-26 MG PO TAB
2 | Freq: Every evening | ORAL | 0 refills | Status: DC
Start: 2023-10-19 — End: 2023-10-19

## 2023-10-19 MED ORDER — ACETAMINOPHEN 325 MG PO TAB
650 mg | ORAL | 0 refills | Status: DC | PRN
Start: 2023-10-19 — End: 2023-10-19

## 2023-10-19 MED ORDER — METOPROLOL SUCCINATE 50 MG PO TB24
50 mg | Freq: Every evening | ORAL | 0 refills | Status: DC
Start: 2023-10-19 — End: 2023-10-19
  Administered 2023-10-19: 07:00:00 50 mg via ORAL

## 2023-10-19 MED ORDER — MELATONIN 5 MG PO TAB
5 mg | Freq: Every evening | ORAL | 0 refills | Status: DC | PRN
Start: 2023-10-19 — End: 2023-10-19

## 2023-10-19 MED ORDER — OXYCODONE 5 MG PO TAB
5 mg | ORAL | 0 refills | Status: DC | PRN
Start: 2023-10-19 — End: 2023-10-19
  Administered 2023-10-19 (×3): 5 mg via ORAL

## 2023-10-19 MED ORDER — METOPROLOL SUCCINATE 100 MG PO TB24
100 mg | Freq: Every day | ORAL | 0 refills | Status: DC
Start: 2023-10-19 — End: 2023-10-19

## 2023-10-19 MED ORDER — ALPRAZOLAM 0.5 MG PO TAB
.5 mg | Freq: Two times a day (BID) | ORAL | 0 refills | Status: DC | PRN
Start: 2023-10-19 — End: 2023-10-19
  Administered 2023-10-19: 07:00:00 0.5 mg via ORAL

## 2023-10-19 NOTE — Progress Notes
[facility] END OF SHIFT/ JHFRAT NOTE    Admission Date: 10/18/2023    Acute events, interventions, provider communication: no    Patient Interventions and Education  Fall Risk/JHFRAT Interventions and Education: (Charting when applicable)  Elimination Interventions : N/A  Medications : Stay within arm's reach during toileting/showering (i.e., dizziness, orthostasis)   Patient Care Equipment: Ensure environment is free of clutter and walkways are clear from tripping hazards  Mobility: N/A  Cognition: N/A  Risk for Moderate/Major Injury: N/A    2. Restraints:  No     Restraint Goal: Patient will be free from injury while physically restrained.  See Docflowsheet for restraint documentation, interventions, education, etc.    Intake and Output:       Intake/Output Summary (Last 24 hours) at 10/19/2023 0409  Last data filed at 10/19/2023 0400  Gross per 24 hour   Intake 0 ml   Output --   Net 0 ml              Last Bowel Movement Date: 10/18/23

## 2023-10-19 NOTE — Care Coordination-Inpatient
[facility] [phone] will take calls on this patient until 8 am.  Afterwards, please contact Med Private R for any questions or concerns.         Preston Fleeting, MD  AOD  On Voalte ---- 8 pm to 8 am

## 2023-10-19 NOTE — Discharge Instructions - Pharmacy
Discharge Summary      Name: Rhonda Prince  Medical Record Number: [id number]        Account Number:  [id number]  Date Of Birth:  January 15, 1986                         Age:  38 y.o.  Admit date:  10/18/2023                     Discharge date: 10/19/2023      Discharge Attending:  Sharl Ma, MD  Discharge Summary Completed By: Neta Ehlers, MD    Service: Med Private R [phone]    Reason for hospitalization:  Chest pain [R07.9]    Primary Discharge Diagnosis:   Chest pain    Hospital Diagnoses:  Hospital Problems        Active Problems    * (Principal) Chest pain     Present on Admission:   Chest pain        Significant Past Medical History        Chronic back pain  Coronary artery disease  Ovarian cyst    Allergies   Morphine, Pcn [penicillins], and Jardiance [empagliflozin]    Brief Hospital Course   The patient was admitted and the following issues were addressed during this hospitalization: (with pertinent details including admission exam/imaging/labs).      Rhonda Prince is a 38 y.o. female with significant history of CAD s/p PCI and CABG x3, HFrEF, ischemic cardiomyopathy, hyperlipidemia, chronic pain, chronic leukocytosis, GERD, anxiety, who presents to the ER with complaints of chest pain.     Atypical chest pain  History of CAD s/p PCI and CABG x3  Ischemic cardiomyopathy  HFrEF, not in acute exacerbation  - Presents with substernal chest pain, radiates to the right neck and right shoulder, constant, not increased with exertion, not relieved with rest  - Last echo 07/24/2023: Left Ventricle: Left ventricle size is normal. Normal wall thickness. Septal motion is consistent with interventricular conduction delay or bundle branch block. Global hypokinesis present. Mildly reduced left ventricular systolic function. EF by 2D Simpsons Biplane is 53%. Grade I diastolic dysfunction (normal LAP).  Hemodynamically significant valvular disease. Visually, RV systolic function is normal.   - Last LHC 12/2022: Prox RCA lesion is 25% stenosed. Ost LAD lesion is 80% stenosed. Ost Cx to Prox Cx lesion is 90% stenosed.   - EKG seems similar to prior EKG (NSR, LBBB)  - HS trops negative   Plan:  > Cardiology consulted given significant cardiac history for possible ischemic eval   > Continue PTA Aspirin, Imdur, Entresto, Metoprolol XL   > Monitor on telemetry (pt refusing)   > Echo ordered (not completed)     Patient left AMA     Items Needing Follow Up   Pending items or areas that need to be addressed at follow up: n/a    Pending Labs and Follow Up Radiology    Pending labs and/or radiology review at this time of discharge are listed below: Please note- any labs with collected status will not have a result; if this area is blank, there are no items for review.         Medications      Medication List      CONTINUE taking these medications     acetaminophen 500 mg tablet; Commonly known as: TYLENOL EXTRA STRENGTH;   Dose: 500 mg; Refills: 0  ALPRAZolam 0.5 mg tablet; Commonly known as: XANAX; Dose: 0.5 mg;   Refills: 0   aspirin EC 81 mg tablet; Commonly known as: ASPIR-LOW; Dose: 81 mg; Take   one tablet by mouth daily. Indications: blood clot prevention following   percutaneous coronary intervention, prevention for a blood clot going to   the brain; For: prevention for a blood clot going to the brain, blood clot   prevention following percutaneous coronary intervention; Quantity: 30   tablet; Refills: 0   cyclobenzaprine 10 mg tablet; Commonly known as: FLEXERIL; Dose: 10 mg;   Refills: 0   duloxetine DR 30 mg capsule; Commonly known as: CYMBALTA; Dose: 30 mg;   Take one capsule by mouth daily. Indications: anxiousness associated with   depression, neuropathic pain; For: anxiousness associated with depression,   neuropathic pain; Quantity: 30 capsule; Refills: 0   ENTRESTO 24-26 mg tablet; Generic drug: sacubitriL-valsartan; Dose: 1   tablet; Refills: 0   folic acid 1 mg tablet; Commonly known as: FOLVITE; Dose: 1 mg; Refills:   0 metoprolol succinate XL 25 mg extended release tablet; Commonly known   as: TOPROL XL; Dose: 50 mg; Take two tablets by mouth twice daily.   Indications: myocardial reinfarction prevention; For: myocardial   reinfarction prevention; Quantity: 120 tablet; Refills: 0   NARCAN 4 mg/actuation nasal spray; Generic drug: naloxone; Dose: 4 mg;   Refills: 0   omeprazole DR 40 mg capsule; Commonly known as: PriLOSEC; Dose: 40 mg;   Refills: 0   oxyCODONE 10 mg tablet; Dose: 10 mg; Refills: 0   polyethylene glycol 3350 17 g packet; Commonly known as: MIRALAX; Dose:   17 g; Refills: 0     STOP taking these medications     torsemide 20 mg tablet; Commonly known as: DEMADEX       Return Appointments and Scheduled Appointments   No appointment scheduled    Consults, Procedures, Diagnostics, Micro, Pathology   Consults: Cardiology  Surgical Procedures & Dates: None  Significant Diagnostic Studies, Micro and Procedures: noted in brief hospital course  Significant Pathology: none                                   Discharge Disposition, Condition   Patient Disposition: Left Against Medical Advice [07]  Condition at Discharge: AMA    Code Status   Full Code    Patient Instructions     Activity       Activity as Tolerated   As directed      It is important to keep increasing your activity level after you leave the hospital.  Moving around can help prevent blood clots, lung infection (pneumonia) and other problems.  Gradually increasing the number of times you are up moving around will help you return to your normal activity level more quickly.  Continue to increase the number of times you are up to the chair and walking daily to return to your normal activity level. Begin to work toward your normal activity level at discharge          Diet       Cardiac Diet   As directed      Limiting unhealthy fats and cholesterol is the most important step you can take in reducing your risk for cardiovascular disease.  Unhealthy fats include saturated and trans fats.  Monitor your sodium and cholesterol intake.  Restrict your sodium to 2g (grams) or  2000mg  (milligrams) daily, and your cholesterol to 200mg  daily.    If you have questions regarding your diet at home, you may contact a dietitian at [phone].               Additional Orders: Case Management, Supplies, Home Health     Home Health/DME       None              Signed:  Neta Ehlers, MD  10/19/2023      cc:  Primary Care Physician:  Sharene Butters   Verified    Referring physicians:  No ref. provider found   Additional provider(s):        Did we miss something? If additional records are needed, please fax a request on office letterhead to [phone]. Please include the patient's name, date of birth, fax number and type of information needed. Additional request can be made by email at [email or web address]. For general questions of information about electronic records sharing, call [phone].

## 2023-10-19 NOTE — Progress Notes
Internal Medicine Progress Note    Name: Rhonda Prince        MRN: [id number]          DOB: Dec 25, 1985            Age: 38 y.o.  Admission Date: 10/18/2023       LOS: 1 day    Date of Service: 10/19/2023    Assessment/Plan:    Principal Problem:    Chest pain    Rhonda Prince is a 38 y.o. female with significant history of CAD s/p PCI and CABG x3, HFrEF, ischemic cardiomyopathy, hyperlipidemia, chronic pain, chronic leukocytosis, GERD, anxiety, who presents to the ER with complaints of chest pain.     Atypical chest pain  History of CAD s/p PCI and CABG x3  Ischemic cardiomyopathy  HFrEF, not in acute exacerbation  - Presents with substernal chest pain, radiates to the right neck and right shoulder, constant, not increased with exertion, not relieved with rest  - Last echo 07/24/2023: Left Ventricle: Left ventricle size is normal. Normal wall thickness. Septal motion is consistent with interventricular conduction delay or bundle branch block. Global hypokinesis present. Mildly reduced left ventricular systolic function. EF by 2D Simpsons Biplane is 53%. Grade I diastolic dysfunction (normal LAP).  Hemodynamically significant valvular disease. Visually, RV systolic function is normal.   - Last LHC 12/2022: Prox RCA lesion is 25% stenosed. Ost LAD lesion is 80% stenosed. Ost Cx to Prox Cx lesion is 90% stenosed.   - EKG seems similar to prior EKG (NSR, LBBB)  - HS trops negative   Plan:  > Cardiology consulted given significant cardiac history for possible ischemic eval   > Continue PTA Aspirin, Imdur, Entresto, Metoprolol XL   > Monitor on telemetry (pt refusing)   > Echo ordered   > Pt left AMA     GERD  > Continue PTA Omeprazole 40 mg (formulary substitution with pantoprazole 40 mg)     Anxiety  > PTA Xanax 0.5 mg twice daily as needed     Chronic pain  > Oxycodone 5 mg q6h prn     Chronic leukocytosis  - Significant workup including bone marrow biopsy done in the past negative  > Monitor       FEN: No IVF, replace prn, Cardiac diet  PPx: Lovenox  Code: Full  Dispo: LEFT AMA    Rhonda Prince, M.D.  Internal Medicine, Hospitalist  Available on Voalte     Discharge Planning: greater than 30 minutes spent in pt dc care today spent counseling pt, coordinating dc care, placing dc orders and helping complete dc summary.  _______________________________________________________________________    Subjective    No acute events overnight.  Patient left AMA this morning.     Discussed plan of care, all questions answered.   Denies any shortness of breath, fevers, chills.       Medications  Scheduled Meds:aspirin EC (ASPIR-LOW) tablet 81 mg, 81 mg, Oral, QDAY  enoxaparin (LOVENOX) syringe 40 mg, 40 mg, Subcutaneous, QDAY(21)  metoprolol succinate XL (TOPROL XL) tablet 100 mg, 100 mg, Oral, QDAY  metoprolol succinate XL (TOPROL XL) tablet 50 mg, 50 mg, Oral, QHS  pantoprazole DR (PROTONIX) tablet 40 mg, 40 mg, Oral, QDAY(21)  sacubitriL-valsartan (ENTRESTO) 24-26 mg tablet 1 tablet, 1 tablet, Oral, QDAY  sacubitriL-valsartan (ENTRESTO) 24-26 mg tablet 2 tablet, 2 tablet, Oral, QHS    Continuous Infusions:  PRN and Respiratory Meds:acetaminophen Q4H PRN, ALPRAZolam BID PRN, melatonin QHS PRN, oxyCODONE Q4H PRN  Review of Systems:  Above    Objective:                          Vital Signs: Last Filed                 Vital Signs: 24 Hour Range   BP: 95/63 (01/31 0410)  Temp: 36.4 ?C (97.6 ?F) (01/31 0410)  Pulse: 76 (01/31 0410)  Respirations: 18 PER MINUTE (01/31 0000)  SpO2: 96 % (01/31 0410)  O2 Device: None (Room air) (01/31 0410) BP: (95-129)/(63-103)   Temp:  [36.4 ?C (97.6 ?F)-37.8 ?C (100.1 ?F)]   Pulse:  [68-113]   Respirations:  [16 PER MINUTE-25 PER MINUTE]   SpO2:  [96 %-99 %]   O2 Device: None (Room air)   Intensity Pain Scale (Self Report): 10 (10/19/23 0100) Vitals:    10/18/23 1627 10/19/23 0045   Weight: 84.8 kg (187 lb) 85.8 kg (189 lb 2.5 oz)         Intake/Output Summary:  (Last 24 hours)    Intake/Output Summary (Last 24 hours) at 10/19/2023 0857  Last data filed at 10/19/2023 0400  Gross per 24 hour   Intake 0 ml   Output --   Net 0 ml           Physical Exam    General: Alert, cooperative, no distress  Head:  Normocephalic  Eyes:  No scleral icterus  Neck:  Supple, symmetrical  Lungs: CTA  Heart:  RRR  Abdomen:  Soft, nontender, nondistended   Skin:  No rashes  Neurologic:  Alert      Lab Review  Pertinent labs reviewed    Point of Care Testing  (Last 24 hours)  Glucose: 84 (10/19/23 0438)    Radiology and other Diagnostics Review:    Pertinent radiology reviewed.    Rhonda Ehlers, MD

## 2023-10-19 NOTE — Case Management (ED)
Discharge Planning Screen      Primary Case Management Team:   Social Work: Sharen Hones (on [place])  Nurse Case Manager: Lilli Few, Phone [phone] (or Amie Critchley)  After-Hours: Please dial hospital operator and request On-Call Case Manager    Initial Huddle Date or Communication with Physician Team: 10/19/2023  Expected Discharge Date: 10/20/2023 3:00 PM  Is Patient Medically Stable: Yes   -------------------------------------------------------------------------------------------------------------------------    Patient was admitted for chest pain on 10/18/2023.    Using sources of EMR, RN staff, and/or huddle discussion and the criteria below, patient was screened for potential discharge planning needs:    Team anticipates post-acute care needs that will exceed patient/caregiver capacity to arrange or manage independently: No  If Yes, full CM assessment is required.    Prior to this episode of care (including at outside hospital), patient's residence was: Family home    Patient is alert and oriented, and likely to remain that way at discharge: Yes   If No, patient  (a) Is at cognitive baseline, (b) has an established caregiver, and (c) home routine is unlikely to change at discharge: N/A    If any answer to (a), (b), or (c) is No, full assessment is required.    Patient is independent in functioning, and likely to remain that way at discharge: Yes   If No, patient   (a) Is at functional baseline, (b) has an established caregiver or adequate assistance, and (c) home routine is unlikely to change at discharge: N/A  If any answer to (a), (b), or (c) is No, full assessment is required.    PSYCHOSOCIAL NEEDS:  If the patient has unstable psychosocial needs and has not had full CM assessment in the past 30 days, full assessment is required.    CM spoke with or attempted to speak with patient today to determine willingness to address behavioral/social/domestic need: N/A  Outcomes of intervention:   The patient is open to addressing these needs during this admission: No.   Further results were: N/A.    Other Case Management Notes: Patient admitted on 10/19/23 for chest pain. Cardiology consulted for ischemic workup. Echo ordered. Anticipate patient to discharge home today pending echo results with no identified NCM/SW needs.    Plan: Likely discharge to home without Case Management needs. Case Management Team will continue to monitor daily for development of needs. Should Case Management needs arise, please call or Voalte Case Management Team noted above. If after hours or on the weekend, please dial hospital operator and request the on-call Case Manager.    Lilli Few, BSN, RN  Med Private R Nurse Case Manager  [phone] and available on [place]

## 2023-10-19 NOTE — Progress Notes
UC notified pt requesting to leave AMA.  Prior to UC arrival, pt removed PIV.  UC educated pt that patient came in for chest pain, work up not completed, patient assuming risk including death by leaving AMA.  Patient verbalized understanding and signed AMA paperwork.

## 2023-10-22 ENCOUNTER — Encounter: Admit: 2023-10-22 | Discharge: 2023-10-22 | Payer: MEDICAID | Primary: Family

## 2023-12-12 ENCOUNTER — Encounter: Admit: 2023-12-12 | Discharge: 2023-12-12 | Payer: MEDICAID | Primary: Family

## 2024-06-26 ENCOUNTER — Encounter: Admit: 2024-06-26 | Discharge: 2024-06-26 | Payer: MEDICARE

## 2024-06-26 ENCOUNTER — Observation Stay: Admit: 2024-06-26 | Discharge: 2024-06-29 | Payer: MEDICARE

## 2024-06-26 ENCOUNTER — Emergency Department: Admit: 2024-06-26 | Discharge: 2024-06-26 | Payer: MEDICARE

## 2024-06-27 ENCOUNTER — Observation Stay: Admit: 2024-06-27 | Discharge: 2024-06-27 | Payer: MEDICARE

## 2024-06-27 ENCOUNTER — Encounter: Admit: 2024-06-27 | Discharge: 2024-06-27 | Payer: MEDICARE

## 2024-06-29 ENCOUNTER — Encounter: Admit: 2024-06-29 | Discharge: 2024-06-29 | Payer: MEDICARE

## 2024-06-29 MED FILL — ISOSORBIDE MONONITRATE 60 MG PO TB24: 60 mg | ORAL | 90 days supply | Qty: 90 | Fill #0 | Status: CP

## 2024-06-29 MED FILL — RANOLAZINE 500 MG PO TB12: 500 mg | ORAL | 90 days supply | Qty: 180 | Fill #0 | Status: CP

## 2024-07-03 ENCOUNTER — Encounter: Admit: 2024-07-03 | Discharge: 2024-07-03 | Payer: MEDICARE

## 2024-07-07 ENCOUNTER — Encounter: Admit: 2024-07-07 | Discharge: 2024-07-07 | Payer: MEDICARE

## 2024-07-23 ENCOUNTER — Encounter: Admit: 2024-07-23 | Discharge: 2024-07-23 | Payer: MEDICARE

## 2024-08-05 ENCOUNTER — Emergency Department: Admit: 2024-08-05 | Discharge: 2024-08-05 | Discharge status: Against Medical Advice | Payer: MEDICARE

## 2024-08-05 ENCOUNTER — Encounter: Admit: 2024-08-05 | Discharge: 2024-08-05 | Payer: MEDICARE

## 2024-08-06 ENCOUNTER — Encounter: Admit: 2024-08-06 | Discharge: 2024-08-06 | Payer: MEDICARE

## 2024-08-06 ENCOUNTER — Emergency Department: Admit: 2024-08-06 | Discharge: 2024-08-06 | Payer: MEDICARE

## 2024-08-06 ENCOUNTER — Observation Stay: Admit: 2024-08-06 | Discharge: 2024-08-08 | Payer: MEDICARE

## 2024-08-07 ENCOUNTER — Observation Stay: Admit: 2024-08-07 | Discharge: 2024-08-07 | Payer: MEDICARE

## 2024-08-07 ENCOUNTER — Encounter: Admit: 2024-08-07 | Discharge: 2024-08-07 | Payer: MEDICARE

## 2024-08-08 ENCOUNTER — Encounter: Admit: 2024-08-08 | Discharge: 2024-08-08 | Payer: MEDICARE

## 2024-08-22 ENCOUNTER — Encounter: Admit: 2024-08-22 | Discharge: 2024-08-22 | Payer: MEDICARE

## 2024-09-26 ENCOUNTER — Encounter: Admit: 2024-09-26 | Discharge: 2024-09-26 | Payer: MEDICARE

## 2024-09-29 ENCOUNTER — Encounter: Admit: 2024-09-29 | Discharge: 2024-09-29 | Payer: MEDICARE

## 2024-10-01 ENCOUNTER — Encounter: Admit: 2024-10-01 | Discharge: 2024-10-01 | Payer: MEDICARE

## 2024-10-07 ENCOUNTER — Encounter: Admit: 2024-10-07 | Discharge: 2024-10-07 | Payer: MEDICARE

## 2024-10-07 ENCOUNTER — Emergency Department: Admit: 2024-10-07 | Discharge: 2024-10-07 | Disposition: A | Discharge status: Home / Self Care | Payer: MEDICARE

## 2024-10-07 ENCOUNTER — Emergency Department: Admit: 2024-10-07 | Discharge: 2024-10-07 | Payer: MEDICARE

## 2024-10-09 ENCOUNTER — Encounter: Admit: 2024-10-09 | Discharge: 2024-10-09 | Payer: MEDICARE

## 2024-10-14 ENCOUNTER — Encounter: Admit: 2024-10-14 | Discharge: 2024-10-14 | Payer: MEDICARE

## 2024-10-16 ENCOUNTER — Encounter: Admit: 2024-10-16 | Discharge: 2024-10-16 | Payer: MEDICARE
